# Patient Record
Sex: Male | Born: 1955
Health system: Southern US, Community
[De-identification: ages and names within clinical notes are randomized; demographics above are authoritative.]

## PROBLEM LIST (undated history)

## (undated) DIAGNOSIS — M1990 Unspecified osteoarthritis, unspecified site: Secondary | ICD-10-CM

## (undated) DIAGNOSIS — IMO0001 Reserved for inherently not codable concepts without codable children: Secondary | ICD-10-CM

## (undated) DIAGNOSIS — E785 Hyperlipidemia, unspecified: Secondary | ICD-10-CM

## (undated) DIAGNOSIS — K219 Gastro-esophageal reflux disease without esophagitis: Secondary | ICD-10-CM

## (undated) DIAGNOSIS — J302 Other seasonal allergic rhinitis: Secondary | ICD-10-CM

## (undated) DIAGNOSIS — L309 Dermatitis, unspecified: Secondary | ICD-10-CM

## (undated) DIAGNOSIS — R7309 Other abnormal glucose: Secondary | ICD-10-CM

## (undated) DIAGNOSIS — F1910 Other psychoactive substance abuse, uncomplicated: Secondary | ICD-10-CM

## (undated) DIAGNOSIS — R011 Cardiac murmur, unspecified: Secondary | ICD-10-CM

## (undated) DIAGNOSIS — S82899A Other fracture of unspecified lower leg, initial encounter for closed fracture: Secondary | ICD-10-CM

## (undated) DIAGNOSIS — I10 Essential (primary) hypertension: Secondary | ICD-10-CM

## (undated) DIAGNOSIS — F32A Depression, unspecified: Secondary | ICD-10-CM

## (undated) DIAGNOSIS — F329 Major depressive disorder, single episode, unspecified: Secondary | ICD-10-CM

## (undated) DIAGNOSIS — E119 Type 2 diabetes mellitus without complications: Secondary | ICD-10-CM

## (undated) DIAGNOSIS — F419 Anxiety disorder, unspecified: Secondary | ICD-10-CM

## (undated) DIAGNOSIS — R030 Elevated blood-pressure reading, without diagnosis of hypertension: Secondary | ICD-10-CM

## (undated) DIAGNOSIS — H269 Unspecified cataract: Secondary | ICD-10-CM

## (undated) HISTORY — DX: Other abnormal glucose: R73.09

## (undated) HISTORY — DX: Anxiety disorder, unspecified: F41.9

## (undated) HISTORY — PX: COLONOSCOPY: SHX174

## (undated) HISTORY — DX: Other seasonal allergic rhinitis: J30.2

## (undated) HISTORY — DX: Unspecified osteoarthritis, unspecified site: M19.90

## (undated) HISTORY — DX: Depression, unspecified: F32.A

## (undated) HISTORY — DX: Type 2 diabetes mellitus without complications: E11.9

## (undated) HISTORY — DX: Dermatitis, unspecified: L30.9

## (undated) HISTORY — DX: Essential (primary) hypertension: I10

## (undated) HISTORY — DX: Hyperlipidemia, unspecified: E78.5

## (undated) HISTORY — PX: JOINT REPLACEMENT: SHX530

## (undated) HISTORY — DX: Other fracture of unspecified lower leg, initial encounter for closed fracture: S82.899A

## (undated) HISTORY — DX: Major depressive disorder, single episode, unspecified: F32.9

## (undated) HISTORY — DX: Other psychoactive substance abuse, uncomplicated: F19.10

## (undated) HISTORY — PX: TONSILLECTOMY: SUR1361

## (undated) HISTORY — DX: Cardiac murmur, unspecified: R01.1

## (undated) HISTORY — DX: Reserved for inherently not codable concepts without codable children: IMO0001

## (undated) HISTORY — DX: Elevated blood-pressure reading, without diagnosis of hypertension: R03.0

## (undated) HISTORY — DX: Unspecified cataract: H26.9

## (undated) HISTORY — DX: Gastro-esophageal reflux disease without esophagitis: K21.9

---

## 2004-05-22 HISTORY — PX: OTHER SURGICAL HISTORY: SHX169

## 2004-12-27 ENCOUNTER — Ambulatory Visit: Payer: Self-pay | Admitting: Internal Medicine

## 2005-01-03 ENCOUNTER — Ambulatory Visit: Payer: Self-pay | Admitting: Internal Medicine

## 2005-03-10 ENCOUNTER — Encounter: Payer: Self-pay | Admitting: Emergency Medicine

## 2005-03-11 ENCOUNTER — Ambulatory Visit: Payer: Self-pay | Admitting: Physical Medicine & Rehabilitation

## 2005-03-11 ENCOUNTER — Inpatient Hospital Stay (HOSPITAL_COMMUNITY): Admission: EM | Admit: 2005-03-11 | Discharge: 2005-03-18 | Payer: Self-pay | Admitting: Emergency Medicine

## 2005-04-04 ENCOUNTER — Inpatient Hospital Stay (HOSPITAL_COMMUNITY): Admission: RE | Admit: 2005-04-04 | Discharge: 2005-04-08 | Payer: Self-pay | Admitting: Orthopedic Surgery

## 2005-04-04 ENCOUNTER — Ambulatory Visit: Payer: Self-pay | Admitting: Physical Medicine & Rehabilitation

## 2005-07-06 ENCOUNTER — Ambulatory Visit: Payer: Self-pay | Admitting: Internal Medicine

## 2005-09-22 ENCOUNTER — Ambulatory Visit: Payer: Self-pay | Admitting: Licensed Clinical Social Worker

## 2005-10-04 ENCOUNTER — Ambulatory Visit: Payer: Self-pay | Admitting: Licensed Clinical Social Worker

## 2005-10-11 ENCOUNTER — Ambulatory Visit: Payer: Self-pay | Admitting: Licensed Clinical Social Worker

## 2005-10-18 ENCOUNTER — Ambulatory Visit: Payer: Self-pay | Admitting: Licensed Clinical Social Worker

## 2005-10-20 ENCOUNTER — Ambulatory Visit: Payer: Self-pay | Admitting: Licensed Clinical Social Worker

## 2005-10-27 ENCOUNTER — Ambulatory Visit: Payer: Self-pay | Admitting: Licensed Clinical Social Worker

## 2005-11-07 ENCOUNTER — Ambulatory Visit (HOSPITAL_BASED_OUTPATIENT_CLINIC_OR_DEPARTMENT_OTHER): Admission: RE | Admit: 2005-11-07 | Discharge: 2005-11-07 | Payer: Self-pay | Admitting: Orthopedic Surgery

## 2005-11-21 ENCOUNTER — Ambulatory Visit: Payer: Self-pay | Admitting: Licensed Clinical Social Worker

## 2005-12-04 ENCOUNTER — Ambulatory Visit: Payer: Self-pay | Admitting: Licensed Clinical Social Worker

## 2005-12-19 ENCOUNTER — Ambulatory Visit: Payer: Self-pay | Admitting: Licensed Clinical Social Worker

## 2006-01-12 ENCOUNTER — Ambulatory Visit: Payer: Self-pay | Admitting: Licensed Clinical Social Worker

## 2006-01-26 ENCOUNTER — Ambulatory Visit: Payer: Self-pay | Admitting: Licensed Clinical Social Worker

## 2006-02-01 ENCOUNTER — Ambulatory Visit: Payer: Self-pay | Admitting: Internal Medicine

## 2006-02-09 ENCOUNTER — Ambulatory Visit: Payer: Self-pay | Admitting: Licensed Clinical Social Worker

## 2006-03-14 ENCOUNTER — Ambulatory Visit: Payer: Self-pay | Admitting: Gastroenterology

## 2006-03-26 ENCOUNTER — Ambulatory Visit: Payer: Self-pay | Admitting: Gastroenterology

## 2006-05-10 ENCOUNTER — Ambulatory Visit: Payer: Self-pay | Admitting: Licensed Clinical Social Worker

## 2006-06-11 ENCOUNTER — Ambulatory Visit: Payer: Self-pay | Admitting: Internal Medicine

## 2006-06-11 LAB — CONVERTED CEMR LAB
ALT: 56 units/L — ABNORMAL HIGH (ref 0–40)
AST: 28 units/L (ref 0–37)
Alkaline Phosphatase: 78 units/L (ref 39–117)
BUN: 18 mg/dL (ref 6–23)
Calcium: 9.7 mg/dL (ref 8.4–10.5)
Chloride: 101 meq/L (ref 96–112)
Cholesterol: 203 mg/dL (ref 0–200)
GFR calc Af Amer: 114 mL/min
Glucose, Bld: 110 mg/dL — ABNORMAL HIGH (ref 70–99)
HDL: 53.1 mg/dL (ref >39.0)
Hgb A1c MFr Bld: 5.8 % (ref 4.6–6.0)
Potassium: 4.1 meq/L (ref 3.5–5.1)
Total Protein: 7.3 g/dL (ref 6.0–8.3)

## 2006-06-13 ENCOUNTER — Ambulatory Visit: Payer: Self-pay | Admitting: Internal Medicine

## 2006-08-09 ENCOUNTER — Ambulatory Visit: Payer: Self-pay | Admitting: Licensed Clinical Social Worker

## 2006-09-04 ENCOUNTER — Ambulatory Visit: Payer: Self-pay | Admitting: Licensed Clinical Social Worker

## 2006-09-18 ENCOUNTER — Ambulatory Visit: Payer: Self-pay | Admitting: Licensed Clinical Social Worker

## 2006-09-25 ENCOUNTER — Ambulatory Visit: Payer: Self-pay | Admitting: Licensed Clinical Social Worker

## 2006-10-02 ENCOUNTER — Ambulatory Visit: Payer: Self-pay | Admitting: Licensed Clinical Social Worker

## 2006-10-10 ENCOUNTER — Ambulatory Visit: Payer: Self-pay | Admitting: Licensed Clinical Social Worker

## 2006-10-22 ENCOUNTER — Ambulatory Visit: Payer: Self-pay | Admitting: Licensed Clinical Social Worker
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
## 2006-10-28 IMAGING — RF DG TIBIA/FIBULA 2V*L*
1 series · 3 of 3 positions shown · non-contrast
Comparison: 03/10/05.

CLINICAL DATA: External fixation for comminuted tibial plateau fracture.
 LEFT TIBIA AND FIBULA ? 2 VIEW ? 03/11/05:

[Series 1: run · 3 of 3 slices shown]
[im 1/3]
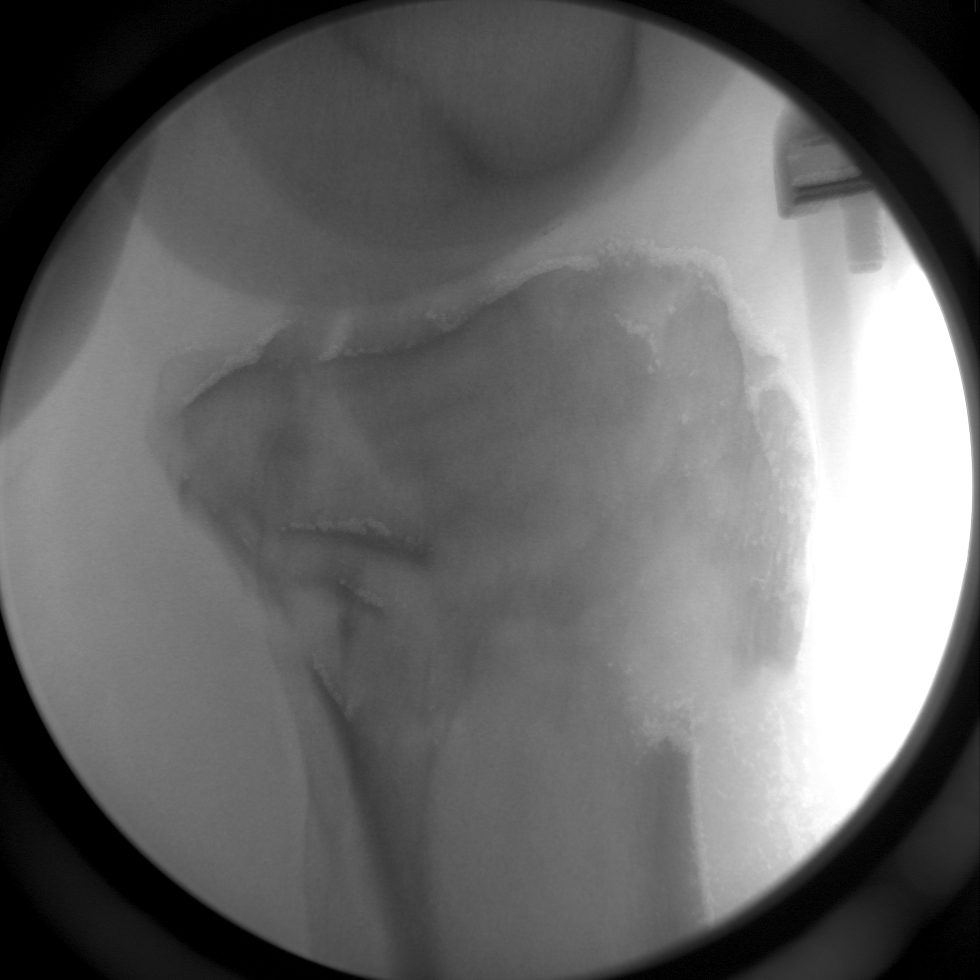
[im 2/3]
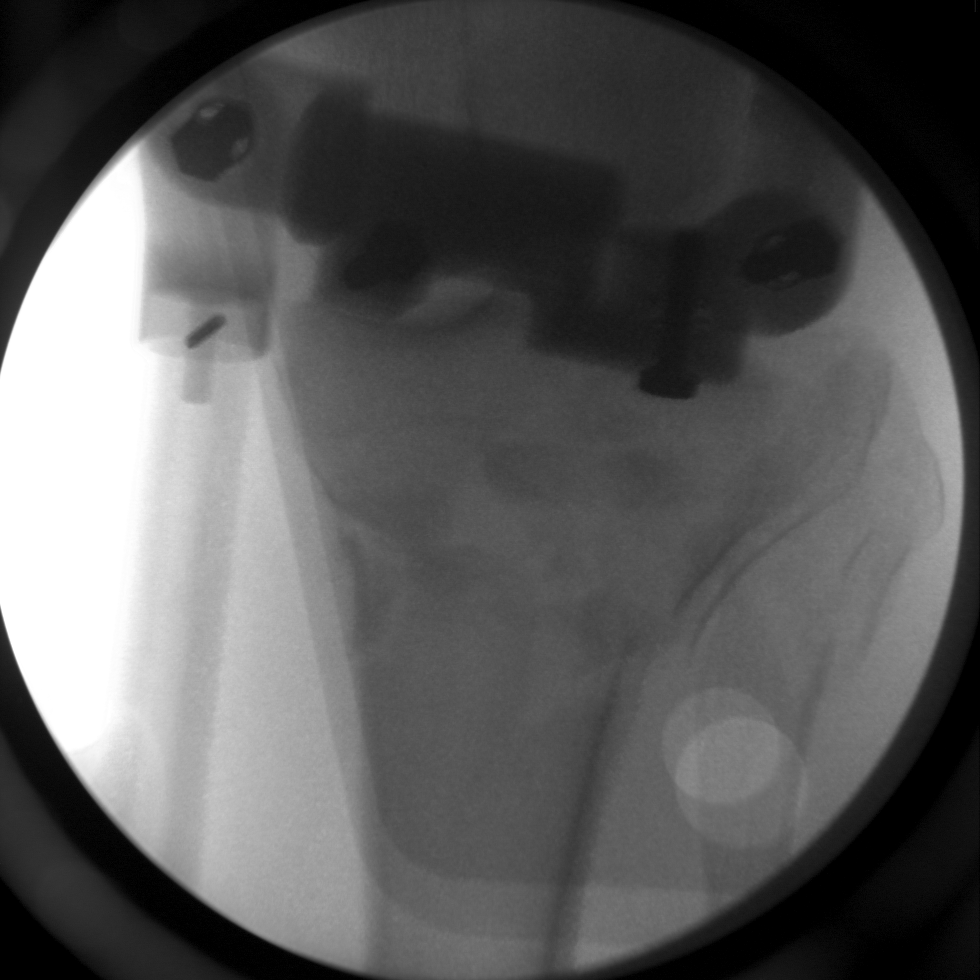
[im 3/3]
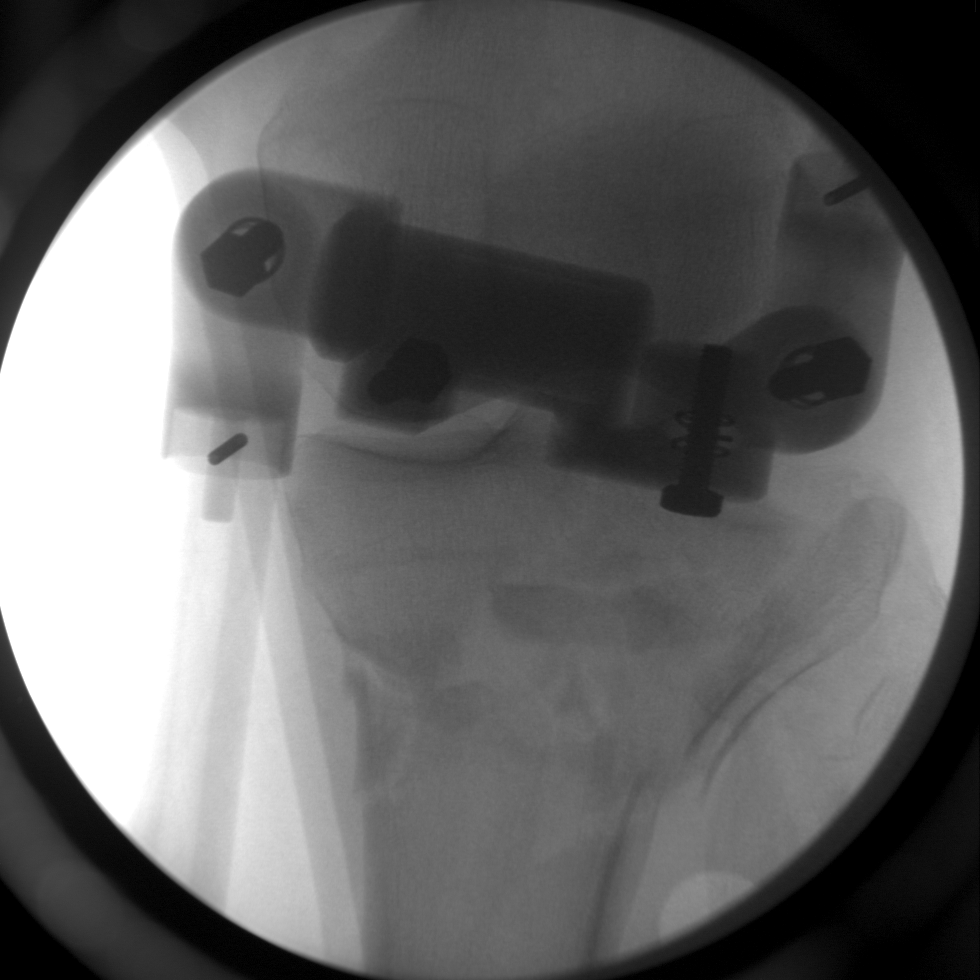

[3 of 3 positions shown; findings below may reference images not displayed]

FINDINGS: Three intraoperative spot fluoro images are submitted postoperatively.  Comminuted tibial plateau fracture with associated fracture of the proximal fibula is again noted.  Images were obtained during/after placement of an external fixator.
IMPRESSION: Intraoperative localization.

## 2006-10-29 ENCOUNTER — Ambulatory Visit: Payer: Self-pay | Admitting: Licensed Clinical Social Worker

## 2006-10-29 IMAGING — CR DG CHEST 1V PORT
1 series · 1 of 1 positions shown · non-contrast
Comparison: 03/10/05.

CLINICAL DATA: Fever.  
 PORTABLE CHEST - 1 VIEW - 03/12/05:

[view not recorded]
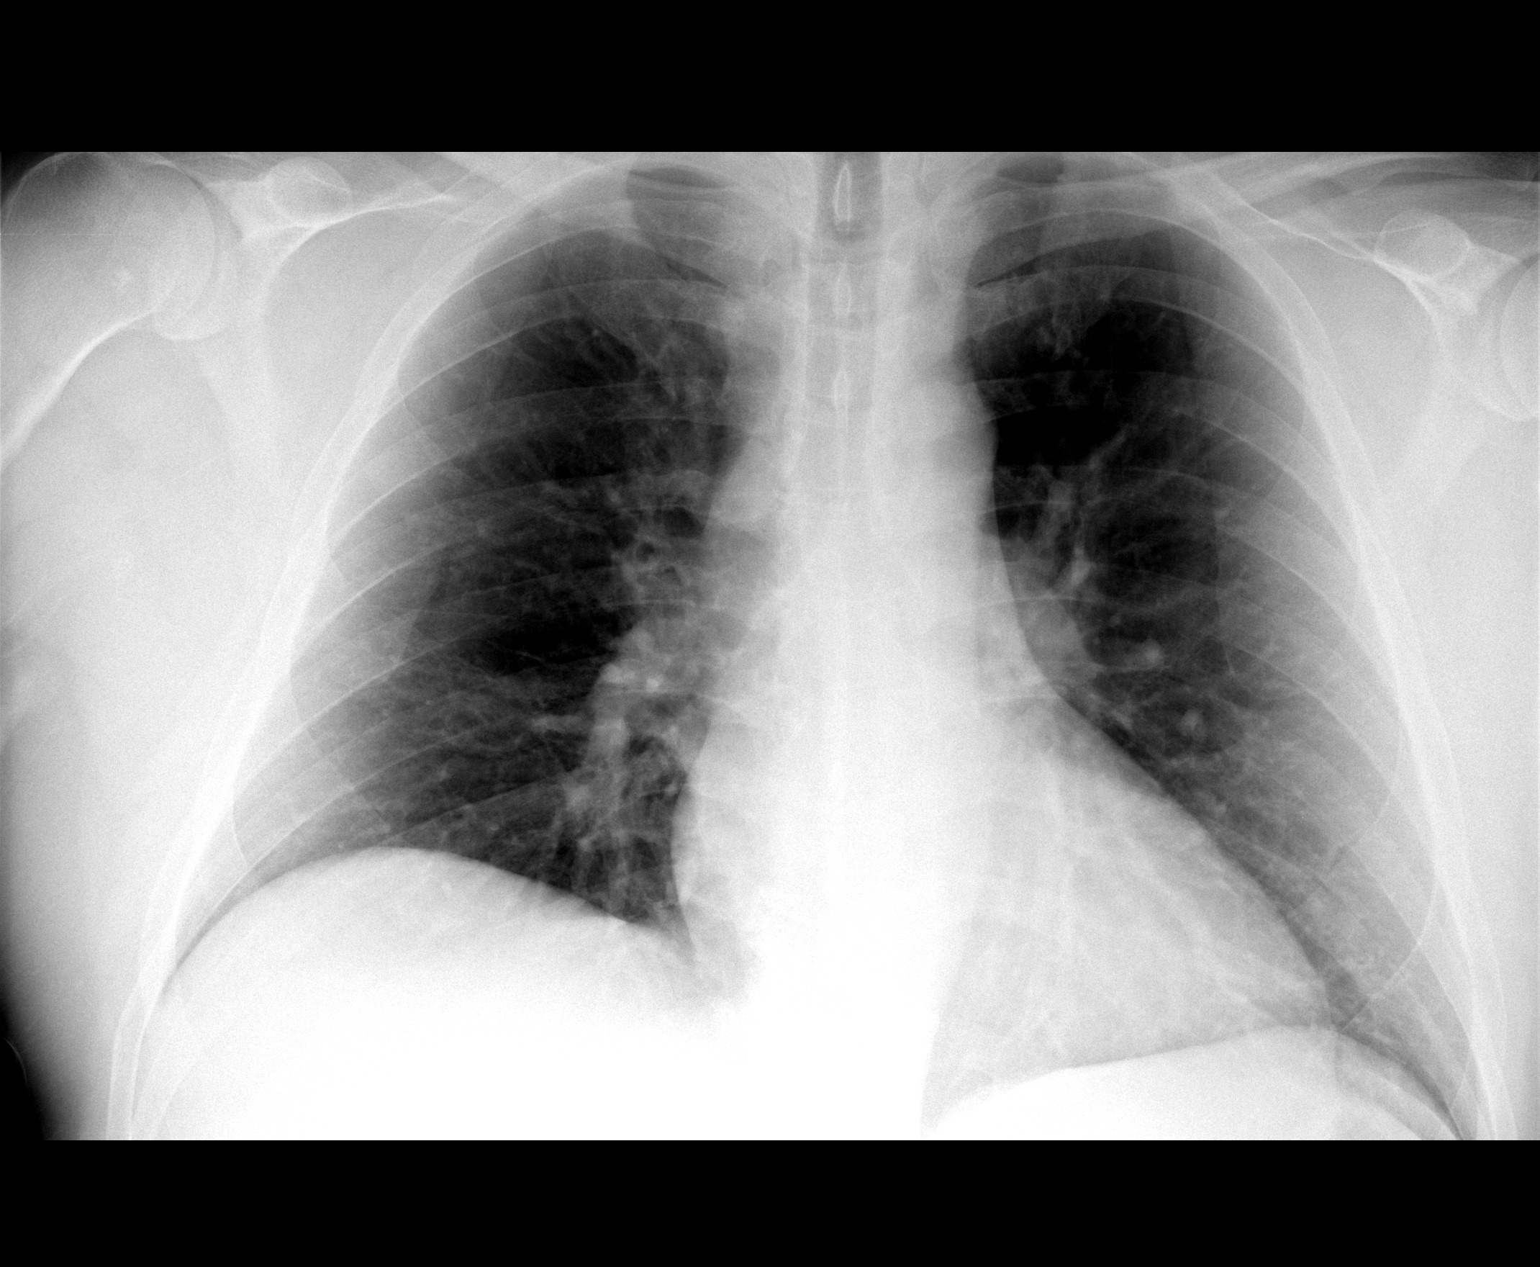

[1 of 1 positions shown; findings below may reference images not displayed]

FINDINGS: The cardiomediastinal silhouette is unchanged.  Mild peribronchial thickening is stable.  There is no evidence of focal air space disease.
IMPRESSION: Mild peribronchial thickening without focal air space disease.

## 2006-11-07 ENCOUNTER — Ambulatory Visit: Payer: Self-pay | Admitting: Licensed Clinical Social Worker

## 2006-11-14 ENCOUNTER — Ambulatory Visit: Payer: Self-pay | Admitting: Licensed Clinical Social Worker

## 2006-11-26 ENCOUNTER — Ambulatory Visit: Payer: Self-pay | Admitting: Licensed Clinical Social Worker

## 2006-12-06 ENCOUNTER — Ambulatory Visit: Payer: Self-pay | Admitting: Licensed Clinical Social Worker

## 2006-12-18 ENCOUNTER — Ambulatory Visit: Payer: Self-pay | Admitting: Licensed Clinical Social Worker

## 2006-12-28 ENCOUNTER — Ambulatory Visit: Payer: Self-pay | Admitting: Licensed Clinical Social Worker

## 2007-01-03 ENCOUNTER — Ambulatory Visit: Payer: Self-pay | Admitting: Licensed Clinical Social Worker

## 2007-01-09 ENCOUNTER — Ambulatory Visit: Payer: Self-pay | Admitting: Licensed Clinical Social Worker

## 2007-01-17 ENCOUNTER — Ambulatory Visit: Payer: Self-pay | Admitting: Licensed Clinical Social Worker

## 2007-02-06 ENCOUNTER — Ambulatory Visit: Payer: Self-pay | Admitting: Licensed Clinical Social Worker

## 2007-02-20 ENCOUNTER — Ambulatory Visit: Payer: Self-pay | Admitting: Licensed Clinical Social Worker

## 2007-03-07 ENCOUNTER — Ambulatory Visit: Payer: Self-pay | Admitting: Licensed Clinical Social Worker

## 2007-03-15 ENCOUNTER — Ambulatory Visit: Payer: Self-pay | Admitting: Licensed Clinical Social Worker

## 2007-03-27 ENCOUNTER — Encounter: Payer: Self-pay | Admitting: *Deleted

## 2007-03-27 ENCOUNTER — Ambulatory Visit: Payer: Self-pay | Admitting: Licensed Clinical Social Worker

## 2007-03-27 DIAGNOSIS — E785 Hyperlipidemia, unspecified: Secondary | ICD-10-CM | POA: Insufficient documentation

## 2007-03-27 DIAGNOSIS — S82899A Other fracture of unspecified lower leg, initial encounter for closed fracture: Secondary | ICD-10-CM | POA: Insufficient documentation

## 2007-03-27 DIAGNOSIS — Z9089 Acquired absence of other organs: Secondary | ICD-10-CM | POA: Insufficient documentation

## 2007-04-04 ENCOUNTER — Ambulatory Visit: Payer: Self-pay | Admitting: Licensed Clinical Social Worker

## 2007-04-12 ENCOUNTER — Ambulatory Visit: Payer: Self-pay | Admitting: Licensed Clinical Social Worker

## 2007-04-19 ENCOUNTER — Ambulatory Visit: Payer: Self-pay | Admitting: Licensed Clinical Social Worker

## 2007-04-22 ENCOUNTER — Ambulatory Visit: Payer: Self-pay | Admitting: Internal Medicine

## 2007-04-22 DIAGNOSIS — L600 Ingrowing nail: Secondary | ICD-10-CM | POA: Insufficient documentation

## 2007-04-22 DIAGNOSIS — F32A Depression, unspecified: Secondary | ICD-10-CM | POA: Insufficient documentation

## 2007-04-22 DIAGNOSIS — I10 Essential (primary) hypertension: Secondary | ICD-10-CM | POA: Insufficient documentation

## 2007-04-22 DIAGNOSIS — F329 Major depressive disorder, single episode, unspecified: Secondary | ICD-10-CM

## 2007-04-24 ENCOUNTER — Encounter: Payer: Self-pay | Admitting: Internal Medicine

## 2007-04-30 ENCOUNTER — Ambulatory Visit: Payer: Self-pay | Admitting: Licensed Clinical Social Worker

## 2007-05-09 ENCOUNTER — Ambulatory Visit: Payer: Self-pay | Admitting: Licensed Clinical Social Worker

## 2007-05-27 ENCOUNTER — Ambulatory Visit: Payer: Self-pay | Admitting: Licensed Clinical Social Worker

## 2007-06-07 ENCOUNTER — Ambulatory Visit: Payer: Self-pay | Admitting: Licensed Clinical Social Worker

## 2007-06-14 ENCOUNTER — Ambulatory Visit: Payer: Self-pay | Admitting: Licensed Clinical Social Worker

## 2007-06-24 ENCOUNTER — Ambulatory Visit: Payer: Self-pay | Admitting: Licensed Clinical Social Worker

## 2007-07-01 ENCOUNTER — Ambulatory Visit: Payer: Self-pay | Admitting: Licensed Clinical Social Worker

## 2007-07-08 ENCOUNTER — Ambulatory Visit: Payer: Self-pay | Admitting: Licensed Clinical Social Worker

## 2007-07-23 ENCOUNTER — Ambulatory Visit: Payer: Self-pay | Admitting: Licensed Clinical Social Worker

## 2007-07-31 ENCOUNTER — Ambulatory Visit: Payer: Self-pay | Admitting: Licensed Clinical Social Worker

## 2007-08-14 ENCOUNTER — Ambulatory Visit: Payer: Self-pay | Admitting: Licensed Clinical Social Worker

## 2007-08-16 ENCOUNTER — Ambulatory Visit: Payer: Self-pay | Admitting: Internal Medicine

## 2007-08-20 LAB — CONVERTED CEMR LAB
ALT: 62 units/L — ABNORMAL HIGH (ref 0–53)
AST: 28 units/L (ref 0–37)
Albumin: 4.3 g/dL (ref 3.5–5.2)
BUN: 19 mg/dL (ref 6–23)
Basophils Relative: 1.3 % — ABNORMAL HIGH (ref 0.0–1.0)
CO2: 27 meq/L (ref 19–32)
Chloride: 105 meq/L (ref 96–112)
Creatinine, Ser: 0.9 mg/dL (ref 0.4–1.5)
Direct LDL: 137 mg/dL
Eosinophils Relative: 1.3 % (ref 0.0–5.0)
HDL: 47 mg/dL (ref >39.0)
Hemoglobin: 14.2 g/dL (ref 13.0–17.0)
Lymphocytes Relative: 27.6 % (ref 12.0–46.0)
MCV: 88.6 fL (ref 78.0–100.0)
Neutro Abs: 3.4 10*3/uL (ref 1.4–7.7)
Neutrophils Relative %: 60.1 % (ref 43.0–77.0)
RBC: 4.8 M/uL (ref 4.22–5.81)
Total Protein: 7.6 g/dL (ref 6.0–8.3)
VLDL: 26 mg/dL (ref 0–40)
WBC: 5.8 10*3/uL (ref 4.5–10.5)

## 2007-08-21 ENCOUNTER — Ambulatory Visit: Payer: Self-pay | Admitting: Internal Medicine

## 2007-08-21 DIAGNOSIS — M1990 Unspecified osteoarthritis, unspecified site: Secondary | ICD-10-CM | POA: Insufficient documentation

## 2007-08-21 DIAGNOSIS — M25559 Pain in unspecified hip: Secondary | ICD-10-CM | POA: Insufficient documentation

## 2007-08-22 ENCOUNTER — Ambulatory Visit: Payer: Self-pay | Admitting: Licensed Clinical Social Worker

## 2007-09-03 ENCOUNTER — Ambulatory Visit: Payer: Self-pay | Admitting: Licensed Clinical Social Worker

## 2007-09-04 ENCOUNTER — Encounter: Payer: Self-pay | Admitting: Internal Medicine

## 2007-09-10 ENCOUNTER — Ambulatory Visit: Payer: Self-pay | Admitting: Licensed Clinical Social Worker

## 2007-09-24 ENCOUNTER — Ambulatory Visit: Payer: Self-pay | Admitting: Licensed Clinical Social Worker

## 2007-10-07 ENCOUNTER — Ambulatory Visit: Payer: Self-pay | Admitting: Licensed Clinical Social Worker

## 2007-10-17 ENCOUNTER — Ambulatory Visit: Payer: Self-pay | Admitting: Licensed Clinical Social Worker

## 2007-11-08 ENCOUNTER — Ambulatory Visit: Payer: Self-pay | Admitting: Licensed Clinical Social Worker

## 2007-12-05 ENCOUNTER — Encounter: Payer: Self-pay | Admitting: Internal Medicine

## 2007-12-09 ENCOUNTER — Ambulatory Visit: Payer: Self-pay | Admitting: Licensed Clinical Social Worker

## 2007-12-23 ENCOUNTER — Ambulatory Visit: Payer: Self-pay | Admitting: Licensed Clinical Social Worker

## 2008-01-08 ENCOUNTER — Ambulatory Visit: Payer: Self-pay | Admitting: Licensed Clinical Social Worker

## 2008-01-16 ENCOUNTER — Ambulatory Visit: Payer: Self-pay | Admitting: Licensed Clinical Social Worker

## 2008-02-03 ENCOUNTER — Ambulatory Visit: Payer: Self-pay | Admitting: Licensed Clinical Social Worker

## 2008-02-14 ENCOUNTER — Ambulatory Visit: Payer: Self-pay | Admitting: Licensed Clinical Social Worker

## 2008-02-14 ENCOUNTER — Ambulatory Visit: Payer: Self-pay | Admitting: Internal Medicine

## 2008-02-18 LAB — CONVERTED CEMR LAB
CO2: 27 meq/L (ref 19–32)
Calcium: 9.1 mg/dL (ref 8.4–10.5)
Chloride: 106 meq/L (ref 96–112)
Direct LDL: 89.6 mg/dL
Hgb A1c MFr Bld: 6.1 % — ABNORMAL HIGH (ref 4.6–6.0)
Potassium: 4 meq/L (ref 3.5–5.1)
Sodium: 139 meq/L (ref 135–145)
Total CHOL/HDL Ratio: 4.3

## 2008-02-21 ENCOUNTER — Ambulatory Visit: Payer: Self-pay | Admitting: Internal Medicine

## 2008-02-21 DIAGNOSIS — R7309 Other abnormal glucose: Secondary | ICD-10-CM | POA: Insufficient documentation

## 2008-03-04 ENCOUNTER — Ambulatory Visit: Payer: Self-pay | Admitting: Licensed Clinical Social Worker

## 2008-03-20 ENCOUNTER — Ambulatory Visit: Payer: Self-pay | Admitting: Internal Medicine

## 2008-03-24 ENCOUNTER — Ambulatory Visit: Payer: Self-pay | Admitting: Licensed Clinical Social Worker

## 2008-05-22 HISTORY — PX: COLONOSCOPY: SHX174

## 2008-05-26 ENCOUNTER — Ambulatory Visit: Payer: Self-pay | Admitting: Licensed Clinical Social Worker

## 2008-06-03 ENCOUNTER — Ambulatory Visit: Payer: Self-pay | Admitting: Licensed Clinical Social Worker

## 2008-06-10 ENCOUNTER — Ambulatory Visit: Payer: Self-pay | Admitting: Licensed Clinical Social Worker

## 2008-06-23 ENCOUNTER — Ambulatory Visit: Payer: Self-pay | Admitting: Internal Medicine

## 2008-06-25 LAB — CONVERTED CEMR LAB
CO2: 27 meq/L (ref 19–32)
Chloride: 105 meq/L (ref 96–112)
Cholesterol: 200 mg/dL (ref 0–200)
Creatinine, Ser: 0.8 mg/dL (ref 0.4–1.5)
Hgb A1c MFr Bld: 6.2 % — ABNORMAL HIGH (ref 4.6–6.0)
Total CHOL/HDL Ratio: 4.6
Triglycerides: 181 mg/dL — ABNORMAL HIGH (ref 0–149)

## 2008-06-29 ENCOUNTER — Ambulatory Visit: Payer: Self-pay | Admitting: Internal Medicine

## 2008-06-30 ENCOUNTER — Ambulatory Visit: Payer: Self-pay | Admitting: Licensed Clinical Social Worker

## 2008-07-14 ENCOUNTER — Ambulatory Visit: Payer: Self-pay | Admitting: Licensed Clinical Social Worker

## 2008-07-21 ENCOUNTER — Ambulatory Visit: Payer: Self-pay | Admitting: Licensed Clinical Social Worker

## 2008-07-31 ENCOUNTER — Ambulatory Visit: Payer: Self-pay | Admitting: Licensed Clinical Social Worker

## 2008-08-03 ENCOUNTER — Encounter: Payer: Self-pay | Admitting: Internal Medicine

## 2008-09-01 ENCOUNTER — Ambulatory Visit: Payer: Self-pay | Admitting: Licensed Clinical Social Worker

## 2008-09-04 ENCOUNTER — Ambulatory Visit: Payer: Self-pay | Admitting: Licensed Clinical Social Worker

## 2008-09-16 ENCOUNTER — Ambulatory Visit: Payer: Self-pay | Admitting: Licensed Clinical Social Worker

## 2008-09-17 ENCOUNTER — Telehealth: Payer: Self-pay | Admitting: Internal Medicine

## 2008-09-25 ENCOUNTER — Ambulatory Visit: Payer: Self-pay | Admitting: Licensed Clinical Social Worker

## 2008-10-05 ENCOUNTER — Ambulatory Visit: Payer: Self-pay | Admitting: Licensed Clinical Social Worker

## 2008-10-13 ENCOUNTER — Ambulatory Visit: Payer: Self-pay | Admitting: Licensed Clinical Social Worker

## 2008-10-20 ENCOUNTER — Ambulatory Visit: Payer: Self-pay | Admitting: Licensed Clinical Social Worker

## 2008-10-28 ENCOUNTER — Ambulatory Visit: Payer: Self-pay | Admitting: Licensed Clinical Social Worker

## 2008-11-11 ENCOUNTER — Ambulatory Visit: Payer: Self-pay | Admitting: Licensed Clinical Social Worker

## 2008-11-26 ENCOUNTER — Ambulatory Visit: Payer: Self-pay | Admitting: Internal Medicine

## 2008-11-26 LAB — CONVERTED CEMR LAB
CO2: 28 meq/L (ref 19–32)
Chloride: 101 meq/L (ref 96–112)
Creatinine, Ser: 0.9 mg/dL (ref 0.4–1.5)
HDL: 45.1 mg/dL (ref >39.00)
Hgb A1c MFr Bld: 6.2 % (ref 4.6–6.5)
Sodium: 138 meq/L (ref 135–145)
Total CHOL/HDL Ratio: 5
Triglycerides: 368 mg/dL — ABNORMAL HIGH (ref 0.0–149.0)
VLDL: 73.6 mg/dL — ABNORMAL HIGH (ref 0.0–40.0)

## 2008-12-01 ENCOUNTER — Ambulatory Visit: Payer: Self-pay | Admitting: Internal Medicine

## 2008-12-02 ENCOUNTER — Ambulatory Visit: Payer: Self-pay | Admitting: Licensed Clinical Social Worker

## 2008-12-30 ENCOUNTER — Ambulatory Visit: Payer: Self-pay | Admitting: Licensed Clinical Social Worker

## 2009-01-27 ENCOUNTER — Ambulatory Visit: Payer: Self-pay | Admitting: Licensed Clinical Social Worker

## 2009-02-05 ENCOUNTER — Ambulatory Visit: Payer: Self-pay | Admitting: Licensed Clinical Social Worker

## 2009-02-19 ENCOUNTER — Ambulatory Visit: Payer: Self-pay | Admitting: Licensed Clinical Social Worker

## 2009-03-05 ENCOUNTER — Ambulatory Visit: Payer: Self-pay | Admitting: Licensed Clinical Social Worker

## 2009-03-23 ENCOUNTER — Ambulatory Visit: Payer: Self-pay | Admitting: Licensed Clinical Social Worker

## 2009-04-07 ENCOUNTER — Ambulatory Visit: Payer: Self-pay | Admitting: Licensed Clinical Social Worker

## 2009-04-12 ENCOUNTER — Ambulatory Visit: Payer: Self-pay | Admitting: Internal Medicine

## 2009-04-12 DIAGNOSIS — Z87891 Personal history of nicotine dependence: Secondary | ICD-10-CM | POA: Insufficient documentation

## 2009-04-14 ENCOUNTER — Ambulatory Visit: Payer: Self-pay | Admitting: Internal Medicine

## 2009-04-20 LAB — CONVERTED CEMR LAB
ALT: 69 units/L — ABNORMAL HIGH (ref 0–53)
AST: 27 units/L (ref 0–37)
Albumin: 4.5 g/dL (ref 3.5–5.2)
Alkaline Phosphatase: 63 units/L (ref 39–117)
Direct LDL: 101.7 mg/dL
Eosinophils Relative: 1.3 % (ref 0.0–5.0)
GFR calc non Af Amer: 124.68 mL/min (ref >60)
HCT: 42.3 % (ref 39.0–52.0)
Hemoglobin: 14.4 g/dL (ref 13.0–17.0)
Hgb A1c MFr Bld: 6.1 % (ref 4.6–6.5)
Lymphs Abs: 1.5 10*3/uL (ref 0.7–4.0)
Monocytes Relative: 9.3 % (ref 3.0–12.0)
Neutro Abs: 4 10*3/uL (ref 1.4–7.7)
Potassium: 4.1 meq/L (ref 3.5–5.1)
Sodium: 137 meq/L (ref 135–145)
Total Bilirubin: 1 mg/dL (ref 0.3–1.2)
Total CHOL/HDL Ratio: 4
Vitamin B-12: 440 pg/mL (ref 211–911)
WBC: 6.3 10*3/uL (ref 4.5–10.5)

## 2009-04-28 ENCOUNTER — Ambulatory Visit: Payer: Self-pay | Admitting: Licensed Clinical Social Worker

## 2009-05-17 ENCOUNTER — Ambulatory Visit: Payer: Self-pay | Admitting: Licensed Clinical Social Worker

## 2009-05-22 HISTORY — PX: JOINT REPLACEMENT: SHX530

## 2009-05-31 ENCOUNTER — Ambulatory Visit: Payer: Self-pay | Admitting: Licensed Clinical Social Worker

## 2009-07-06 ENCOUNTER — Ambulatory Visit: Payer: Self-pay | Admitting: Licensed Clinical Social Worker

## 2009-07-08 ENCOUNTER — Telehealth: Payer: Self-pay | Admitting: Internal Medicine

## 2009-07-08 ENCOUNTER — Ambulatory Visit: Payer: Self-pay | Admitting: Internal Medicine

## 2009-07-08 LAB — CONVERTED CEMR LAB
BUN: 14 mg/dL (ref 6–23)
Bilirubin, Direct: 0.1 mg/dL (ref 0.0–0.3)
CO2: 29 meq/L (ref 19–32)
Calcium: 9.3 mg/dL (ref 8.4–10.5)
Cholesterol: 176 mg/dL (ref 0–200)
Creatinine, Ser: 0.8 mg/dL (ref 0.4–1.5)
HDL: 47.5 mg/dL (ref >39.00)
Total CHOL/HDL Ratio: 4
Total Protein: 7.5 g/dL (ref 6.0–8.3)
Triglycerides: 172 mg/dL — ABNORMAL HIGH (ref 0.0–149.0)

## 2009-07-09 ENCOUNTER — Telehealth: Payer: Self-pay | Admitting: Internal Medicine

## 2009-07-15 ENCOUNTER — Ambulatory Visit: Payer: Self-pay | Admitting: Licensed Clinical Social Worker

## 2009-07-15 ENCOUNTER — Ambulatory Visit: Payer: Self-pay | Admitting: Internal Medicine

## 2009-07-15 DIAGNOSIS — L259 Unspecified contact dermatitis, unspecified cause: Secondary | ICD-10-CM | POA: Insufficient documentation

## 2009-09-13 ENCOUNTER — Telehealth: Payer: Self-pay | Admitting: Internal Medicine

## 2009-09-28 ENCOUNTER — Ambulatory Visit: Payer: Self-pay | Admitting: Licensed Clinical Social Worker

## 2009-10-26 ENCOUNTER — Ambulatory Visit: Payer: Self-pay | Admitting: Licensed Clinical Social Worker

## 2009-11-09 ENCOUNTER — Ambulatory Visit: Payer: Self-pay | Admitting: Internal Medicine

## 2009-11-09 LAB — CONVERTED CEMR LAB
ALT: 42 units/L (ref 0–53)
Albumin: 4.6 g/dL (ref 3.5–5.2)
BUN: 23 mg/dL (ref 6–23)
Bilirubin Urine: NEGATIVE
CO2: 28 meq/L (ref 19–32)
Calcium: 9.3 mg/dL (ref 8.4–10.5)
Chloride: 104 meq/L (ref 96–112)
Cholesterol: 198 mg/dL (ref 0–200)
Creatinine, Ser: 0.9 mg/dL (ref 0.4–1.5)
Eosinophils Absolute: 0.1 10*3/uL (ref 0.0–0.7)
Eosinophils Relative: 1.6 % (ref 0.0–5.0)
Glucose, Bld: 106 mg/dL — ABNORMAL HIGH (ref 70–99)
HCT: 41.7 % (ref 39.0–52.0)
HDL: 47 mg/dL (ref >39.00)
Hemoglobin, Urine: NEGATIVE
Ketones, ur: NEGATIVE mg/dL
Leukocytes, UA: NEGATIVE
Lymphs Abs: 1.6 10*3/uL (ref 0.7–4.0)
MCHC: 34.2 g/dL (ref 30.0–36.0)
MCV: 88.1 fL (ref 78.0–100.0)
Monocytes Absolute: 0.6 10*3/uL (ref 0.1–1.0)
Neutrophils Relative %: 65.3 % (ref 43.0–77.0)
Nitrite: NEGATIVE
Platelets: 222 10*3/uL (ref 150.0–400.0)
TSH: 1.22 microintl units/mL (ref 0.35–5.50)
Total Protein, Urine: NEGATIVE mg/dL
Total Protein: 7.6 g/dL (ref 6.0–8.3)
Triglycerides: 380 mg/dL — ABNORMAL HIGH (ref 0.0–149.0)
pH: 6 (ref 5.0–8.0)

## 2009-11-12 ENCOUNTER — Ambulatory Visit: Payer: Self-pay | Admitting: Internal Medicine

## 2009-12-02 ENCOUNTER — Ambulatory Visit: Payer: Self-pay | Admitting: Licensed Clinical Social Worker

## 2009-12-21 ENCOUNTER — Ambulatory Visit: Payer: Self-pay | Admitting: Licensed Clinical Social Worker

## 2010-05-09 ENCOUNTER — Ambulatory Visit: Payer: Self-pay | Admitting: Internal Medicine

## 2010-05-10 ENCOUNTER — Ambulatory Visit: Payer: Self-pay | Admitting: Licensed Clinical Social Worker

## 2010-05-12 LAB — CONVERTED CEMR LAB
BUN: 20 mg/dL (ref 6–23)
Basophils Absolute: 0.1 10*3/uL (ref 0.0–0.1)
Bilirubin, Direct: 0.1 mg/dL (ref 0.0–0.3)
Blood, UA: NEGATIVE
Chloride: 102 meq/L (ref 96–112)
Cholesterol: 194 mg/dL (ref 0–200)
Creatinine, Ser: 0.7 mg/dL (ref 0.4–1.5)
Eosinophils Absolute: 0.2 10*3/uL (ref 0.0–0.7)
Glucose, Bld: 99 mg/dL (ref 70–99)
HCT: 40.4 % (ref 39.0–52.0)
HDL: 40.1 mg/dL (ref >39.00)
Leukocytes, UA: NEGATIVE
Lymphs Abs: 1.9 10*3/uL (ref 0.7–4.0)
MCV: 86.7 fL (ref 78.0–100.0)
Monocytes Absolute: 0.8 10*3/uL (ref 0.1–1.0)
Neutrophils Relative %: 62.4 % (ref 43.0–77.0)
Nitrite: NEGATIVE
PSA: 1.03 ng/mL (ref 0.10–4.00)
Platelets: 249 10*3/uL (ref 150.0–400.0)
Potassium: 4.2 meq/L (ref 3.5–5.1)
RDW: 13.7 % (ref 11.5–14.6)
TSH: 1.55 microintl units/mL (ref 0.35–5.50)
Total Bilirubin: 0.6 mg/dL (ref 0.3–1.2)
Total Protein, Urine: NEGATIVE mg/dL
Triglycerides: 346 mg/dL — ABNORMAL HIGH (ref 0.0–149.0)
VLDL: 69.2 mg/dL — ABNORMAL HIGH (ref 0.0–40.0)
pH: 6 (ref 5.0–8.0)

## 2010-05-18 ENCOUNTER — Encounter: Payer: Self-pay | Admitting: Internal Medicine

## 2010-05-18 ENCOUNTER — Ambulatory Visit: Payer: Self-pay | Admitting: Internal Medicine

## 2010-05-22 HISTORY — PX: HIP SURGERY: SHX245

## 2010-05-24 ENCOUNTER — Telehealth (INDEPENDENT_AMBULATORY_CARE_PROVIDER_SITE_OTHER): Payer: Self-pay | Admitting: *Deleted

## 2010-05-25 ENCOUNTER — Encounter: Payer: Self-pay | Admitting: *Deleted

## 2010-05-25 ENCOUNTER — Encounter: Payer: Self-pay | Admitting: Cardiology

## 2010-05-25 ENCOUNTER — Ambulatory Visit: Admission: RE | Admit: 2010-05-25 | Discharge: 2010-05-25 | Payer: Self-pay | Source: Home / Self Care

## 2010-05-25 ENCOUNTER — Encounter (HOSPITAL_COMMUNITY)
Admission: RE | Admit: 2010-05-25 | Discharge: 2010-06-21 | Payer: Self-pay | Source: Home / Self Care | Attending: Internal Medicine | Admitting: Internal Medicine

## 2010-05-31 ENCOUNTER — Ambulatory Visit: Admit: 2010-05-31 | Payer: Self-pay | Admitting: Licensed Clinical Social Worker

## 2010-06-13 LAB — BASIC METABOLIC PANEL
BUN: 14 mg/dL (ref 6–23)
CO2: 27 mEq/L (ref 19–32)
Calcium: 10.1 mg/dL (ref 8.4–10.5)
Chloride: 102 mEq/L (ref 96–112)
Creatinine, Ser: 0.83 mg/dL (ref 0.4–1.5)
GFR calc Af Amer: >60 mL/min (ref >60)
GFR calc non Af Amer: >60 mL/min (ref >60)
Glucose, Bld: 84 mg/dL (ref 70–99)
Potassium: 4.3 mEq/L (ref 3.5–5.1)
Sodium: 137 mEq/L (ref 135–145)

## 2010-06-13 LAB — CBC
HCT: 42.3 % (ref 39.0–52.0)
Hemoglobin: 14.4 g/dL (ref 13.0–17.0)
MCH: 29.3 pg (ref 26.0–34.0)
MCHC: 34 g/dL (ref 30.0–36.0)
MCV: 86 fL (ref 78.0–100.0)
Platelets: 239 10*3/uL (ref 150–400)
RBC: 4.92 MIL/uL (ref 4.22–5.81)
RDW: 13 % (ref 11.5–15.5)
WBC: 7.1 10*3/uL (ref 4.0–10.5)

## 2010-06-13 LAB — SURGICAL PCR SCREEN
MRSA, PCR: NEGATIVE
Staphylococcus aureus: POSITIVE — AB

## 2010-06-13 LAB — TYPE AND SCREEN: Antibody Screen: NEGATIVE

## 2010-06-13 LAB — PROTIME-INR
INR: 0.93 (ref 0.00–1.49)
Prothrombin Time: 12.7 seconds (ref 11.6–15.2)

## 2010-06-13 LAB — ABO/RH: ABO/RH(D): A NEG

## 2010-06-14 ENCOUNTER — Inpatient Hospital Stay (HOSPITAL_COMMUNITY)
Admission: RE | Admit: 2010-06-14 | Discharge: 2010-06-16 | Payer: Self-pay | Source: Home / Self Care | Attending: Orthopedic Surgery | Admitting: Orthopedic Surgery

## 2010-06-15 LAB — CBC
HCT: 29.9 % — ABNORMAL LOW (ref 39.0–52.0)
MCHC: 33.1 g/dL (ref 30.0–36.0)
MCV: 87.9 fL (ref 78.0–100.0)
Platelets: 187 10*3/uL (ref 150–400)
RDW: 13.7 % (ref 11.5–15.5)
WBC: 11.1 10*3/uL — ABNORMAL HIGH (ref 4.0–10.5)

## 2010-06-15 LAB — PROTIME-INR: INR: 1.05 (ref 0.00–1.49)

## 2010-06-15 LAB — BASIC METABOLIC PANEL
BUN: 15 mg/dL (ref 6–23)
Calcium: 7.8 mg/dL — ABNORMAL LOW (ref 8.4–10.5)
GFR calc non Af Amer: >60 mL/min (ref >60)
Glucose, Bld: 144 mg/dL — ABNORMAL HIGH (ref 70–99)
Potassium: 3.7 mEq/L (ref 3.5–5.1)

## 2010-06-16 LAB — BASIC METABOLIC PANEL
BUN: 11 mg/dL (ref 6–23)
Calcium: 7.9 mg/dL — ABNORMAL LOW (ref 8.4–10.5)
Creatinine, Ser: 0.73 mg/dL (ref 0.4–1.5)
GFR calc Af Amer: >60 mL/min (ref >60)
GFR calc non Af Amer: >60 mL/min (ref >60)

## 2010-06-16 LAB — PROTIME-INR
INR: 1.09 (ref 0.00–1.49)
Prothrombin Time: 14.3 seconds (ref 11.6–15.2)

## 2010-06-16 LAB — CBC
HCT: 28.2 % — ABNORMAL LOW (ref 39.0–52.0)
MCHC: 33 g/dL (ref 30.0–36.0)
Platelets: 166 10*3/uL (ref 150–400)
RDW: 13.5 % (ref 11.5–15.5)
WBC: 10.6 10*3/uL — ABNORMAL HIGH (ref 4.0–10.5)

## 2010-06-16 NOTE — Op Note (Addendum)
Roberto Swanson, Roberto Swanson NO.:  [id number]  MEDICAL RECORD NO.:  [id number]          PATIENT TYPE:  INP  LOCATION:  5013                         FACILITY:  [facility]  PHYSICIAN:  Eulas Post, MD    DATE OF BIRTH:  Dec 08, 1955  DATE OF PROCEDURE:  06/14/2010 DATE OF DISCHARGE:                              OPERATIVE REPORT   PREOPERATIVE DIAGNOSIS:  Right hip osteoarthritis.  POSTOPERATIVE DIAGNOSIS:  Right hip osteoarthritis.  OPERATIVE PROCEDURE:  Right total hip arthroplasty.  ANESTHESIA:  General.  ESTIMATED BLOOD LOSS:  350 mL.  FIRST ASSISTANT:  Erskine Squibb B. Su Hilt, PA-C  OPERATIVE IMPLANTS:  DePuy Summit size 5 Press-Fit tapered hip stem with standard offset with a size 54-mm acetabular shell pinnacle Porocoat sector 2 with a single apex hole eliminator and a size 36-mm inner diameter pinnacle Marathon polyethylene lipped liner +4, 10 degrees with a ceramic Biolox Delta femoral head size +5 x 36 mm. PREOPERATIVE INDICATIONS:  Roberto Swanson is a 55 year old gentleman who had severe right hip osteoarthritis with stiffness and hypertrophic bone growth.  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  He elected to undergo right total hip arthroplasty.  The risks, benefits, and alternatives were discussed for the procedure including but not limited to risks of infection, bleeding, nerve injury, dislocation, leg length discrepancy, sciatic nerve palsy, blood clots, heterotopic ossification, blood transfusion, bleeding complications, cardiopulmonary complications, among others and he is willing to proceed.  We also discussed using indomethacin postoperatively in order to minimize his risk for heterotopic ossification.  We also discussed radiation, but he had a family member who died of a radiation-type induced sarcoma and he did not want to have radiation by any means.  We also discussed the risks that indomethacin could increase the  risk for bleeding given the fact that we have to anticoagulate postoperatively, however, nevertheless, this would be the optimal course of action given all things considered.  OPERATIVE PROCEDURE:  The patient was brought to the operating room and placed in supine position.  IV Ancef was given.  He did develop a little bit of a red splotchy chest during the positioning, and I was not sure whether or not he actually had an allergic reaction to Ancef or not. Nevertheless, I am going to given vancomycin postoperatively because of this questionable sensitivity.  A time-out was performed and the patient was turned in the lateral decubitus position.  General anesthesia was administered.  The right hip was towards the ceiling and all prominences were padded including an axillary roll.  The right lower extremity was prepped and draped in the usual sterile fashion.  Posterolateral approach to the hip was performed.  The piriformis and external rotators were tagged with #2 FiberWire and a T-type capsulotomy was performed.  There was substantial hypertrophic osteophyte formation.  The hip was dislocated and it had a very large osteophyte and head itself was fairly massive.  The deep retractors were placed and I exposed the acetabulum.  There was a substantial amount of the ossified labrum as well as heterotopic bone around the acetabulum.  This was removed using an  osteotome.  I then exposed the acetabulum and sequentially reamed.  I medialized first using a 42 reamer and then sequentially reamed up to prepare for a size 54 shell.  The  trial fit well and so I impacted the real implant into place into the appropriate version and inclination.  I had good bleeding bone, and I suspect that I could have medialized even more, however, I had an excellent shell that was capable of supporting the acetabulum and so therefore I stopped without going completely medial.  I still had even extensive osteophyte  that I removed even after placement of the shell using a rongeur.  This was both anterior and posterior.  Once the shell was in place, I placed a trial lipped liner and turned my attention to the femur.  The deep retractors were removed including the wing and the proximal femur exposed.  I used a cookie cutter followed by opening reamer and then lateralizing reamer and then sequentially reamed and then broached up to a size 5.  I then trialed and the +1.5 was a little too loose, although the stability was relatively good, but it was very easy to reduce and for that reason I elected to go with the +5.  This restored leg length better and the hip was extremely stable throughout functional range of motion.  There were even still yet more osteophytes that I removed anteriorly and posteriorly at this point in order to minimize impingement.  Once the final components had been selected, I placed the real femoral component and femoral head as well as the lipped liner.  The hip was extremely stable through a functional range of motion.  I irrigated the wounds copiously and repaired the capsule through drill holes in the greater trochanter using #2 FiberWire.  Excellent posterior closure was achieved.  I irrigated it again once more and then repaired the fascia with #1 followed by 2-0 and 4-0 for the skin.  Steri-Strips and sterile gauze was applied.  He was awakened and turned supine and returned to PACU in stable and satisfactory condition.  Roberto Swanson was present and scrubbed and critical throughout the case for retraction, dislocation, and reduction maneuvers, closure, and positioning.  There were no complications and he tolerated the procedure well.     Eulas Post, MD     JPL/MEDQ  D:  06/14/2010  T:  06/14/2010  Job:  [id number]  Electronically Signed by Teryl Lucy MD on 06/16/2010 05:30:57 PM

## 2010-06-20 ENCOUNTER — Encounter: Payer: Self-pay | Admitting: Internal Medicine

## 2010-06-21 DIAGNOSIS — Z96641 Presence of right artificial hip joint: Secondary | ICD-10-CM | POA: Insufficient documentation

## 2010-06-21 NOTE — Progress Notes (Signed)
Summary: Roberto Swanson  Phone Note Other Incoming   Caller: lab Summary of Call: sent add on slip to lab for lipid and a1c---karen called from lab stating the a1c could not be added---pt will have to come in and have labs redone/vg Initial call taken by: Tora Perches,  July 09, 2009 8:56 AM  Follow-up for Phone Call        noted. Thx Follow-up by: Tresa Garter MD,  July 09, 2009 1:36 PM
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
## 2010-06-21 NOTE — Progress Notes (Signed)
Summary: add on lab  Phone Note Other Incoming   Summary of Call: Needs A1c and lipids 790.29  272.2 added to current labs pls Initial call taken by: Tresa Garter MD,  July 08, 2009 1:30 PM  Follow-up for Phone Call        add on sheet faxed to lab at [phone] Follow-up by: Rock Nephew CMA,  July 08, 2009 3:40 PM

## 2010-06-21 NOTE — Progress Notes (Signed)
  Phone Note Refill Request Message from:  Fax from Pharmacy on September 13, 2009 1:00 PM  Refills Requested: Medication #1:  LIPITOR 20 MG  TABS Take one tablet once daily Initial call taken by: Rock Nephew CMA,  September 13, 2009 1:00 PM    Prescriptions: LIPITOR 20 MG  TABS (ATORVASTATIN CALCIUM) Take one tablet once daily  #30 x 11   Entered by:   Rock Nephew CMA   Authorized by:   Tresa Garter MD   Signed by:   Rock Nephew CMA on 09/13/2009   Method used:   Electronically to        [facility]* (retail)       [address]       [place], [place]  [id number]       Ph: [phone]       Fax: [phone]   RxID:   [id number]

## 2010-06-23 NOTE — Assessment & Plan Note (Signed)
Summary: Cardiology Nuclear Testing  Nuclear Med Background Indications for Stress Test: Evaluation for Ischemia, Surgical Clearance  Indications Comments: Pending THR 06/14/10 by Dr. Teryl Lucy   History Comments: No documented CAD  Symptoms: DOE    Nuclear Pre-Procedure Cardiac Risk Factors: History of Smoking, Hypertension, Lipids, Obesity Caffeine/Decaff Intake: None NPO After: 8:30 PM Lungs: Clear IV 0.9% NS with Angio Cath: 20g     IV Site: R Antecubital IV Started by: Irean Hong, RN Chest Size (in) 44     Height (in): 71 Weight (lb): 234 BMI: 32.75  Nuclear Med Study 1 or 2 day study:  1 day     Stress Test Type:  Treadmill/Lexiscan Reading MD:  Cassell Clement, MD     Referring MD:  Sonda Primes, MD Resting Radionuclide:  Technetium 98m Tetrofosmin     Resting Radionuclide Dose:  11 mCi  Stress Radionuclide:  Technetium 22m Tetrofosmin     Stress Radionuclide Dose:  33 mCi   Stress Protocol Exercise Time (min):  6:45 min     Max HR:  131 bpm     Predicted Max HR:  165 bpm  Max Systolic BP: 168 mm Hg     Percent Max HR:  79.39 %     METS: 5.6 Rate Pressure Product:  16109    Stress Test Technologist:  [employer], CMA-N     Nuclear Technologist:  Domenic Polite, CNMT  Rest Procedure  Myocardial perfusion imaging was performed at rest 45 minutes following the intravenous administration of Technetium 49m Tetrofosmin.  Stress Procedure  Patient initially walked the treadmill utilizing the Bruce protocol, but was unable to get his heart rate up due to right hip pain and hypertensive response, 154/110.  He then received IV Lexiscan 0.4 mg over 15-seconds with concurrent low level exercise and then Technetium 35m Tetrofosmin was injected at 30-seconds.  There were no significant changes with infusion and he denied any chest pain.  Quantitative spect images were obtained after a 45 minute delay.  QPS Raw Data Images:  Normal; no motion artifact; normal  heart/lung ratio. Stress Images:  Normal homogeneous uptake in all areas of the myocardium. Rest Images:  Normal homogeneous uptake in all areas of the myocardium. Subtraction (SDS):  No evidence of ischemia. Transient Ischemic Dilatation:  .95  (Normal <1.22)  Lung/Heart Ratio:  .28  (Normal <0.45)  Quantitative Gated Spect Images QGS EDV:  131 ml QGS ESV:  48 ml QGS EF:  63 % QGS cine images:  Normal wall motion  Findings Normal nuclear study      Overall Impression  Exercise Capacity: Poor exercise capacity.  Converted to Lexiscan --hip pain. BP Response: Hypertensive blood pressure response. Clinical Symptoms: No chest pain ECG Impression: No significant ST segment change suggestive of ischemia. Overall Impression: Normal stress nuclear study.  Appended Document: Cardiology Nuclear Testing Misty Stanley, please inform - heart stress test was nl  Thanks, AP   Appended Document: Cardiology Nuclear Testing left detailed mess informing pt of above

## 2010-06-23 NOTE — Assessment & Plan Note (Signed)
Summary: 6 mos f/u/#/cd   Vital Signs:  Patient profile:   55 year old male Height:      71 inches Weight:      235 pounds BMI:     32.89 Temp:     98.8 degrees F oral Pulse rate:   72 / minute Pulse rhythm:   regular Resp:     16 per minute BP sitting:   130 / 82  (left arm) Cuff size:   large  Vitals Entered By: Lanier Prude, Beverly Gust) (May 18, 2010 7:58 AM) CC: CPX Is Patient Diabetic? No   CC:  CPX.  History of Present Illness: Req byDr Dion Saucier IM consult Reason: THR R on Jan, 24  2012 Hx: 55 yo with hip depression, HTN, dyslipidemia - all stable.  Current Medications (verified): 1)  Wellbutrin Sr 150 Mg  Tb12 (Bupropion Hcl) .... Take One Tablet Twice Daily 2)  Vitamin-B Complex   Tabs (B Complex Vitamins) .Marland Kitchen.. 1 Qd 3)  Vitamin D3 1000 Unit  Tabs (Cholecalciferol) .Marland Kitchen.. 1 Qd 4)  Aspirin 81 Mg  Tabs (Aspirin) .... Take One Tablet Once Daily 5)  Celexa 10 Mg Tabs (Citalopram Hydrobromide) .Marland Kitchen.. 1 1/2 Once Daily 6)  Triamcinolone Acetonide 0.5 % Crea (Triamcinolone Acetonide) .... Use Two Times A Day Prn 7)  Vimovo 500-20 Mg Tbec (Naproxen-Esomeprazole) .Marland Kitchen.. 1 By Mouth Once Daily - Two Times A Day Pc As Needed Pain 8)  Lipitor 40 Mg Tabs (Atorvastatin Calcium) .Marland Kitchen.. 1 By Mouth Qd  Allergies (verified): No Known Drug Allergies  Past History:  Past Medical History: Last updated: 07/15/2009 Hx of FRACTURE, LEG, LEFT (ICD-827.0) 2006 HYPERLIPIDEMIA (ICD-272.4), elev. TG Elev. BP  Elev glucose Depression Dr Nolen Mu Osteoarthritis R hip hand eczema  Past Surgical History: TONSILLECTOMY, HX OF (ICD-V45.79) L leg fracture repair     Family History: M rhem. fever DM F CAD and he had AVR  Social History: Occupation: Financial controller Single Alcohol use-yes - 1 glass twice a week Regular exercise-yes  Review of Systems       The patient complains of difficulty walking.  The patient denies anorexia, fever, weight loss, weight gain, vision  loss, decreased hearing, hoarseness, chest pain, syncope, dyspnea on exertion, peripheral edema, prolonged cough, headaches, hemoptysis, abdominal pain, melena, hematochezia, severe indigestion/heartburn, hematuria, incontinence, genital sores, muscle weakness, suspicious skin lesions, transient blindness, depression, unusual weight change, abnormal bleeding, enlarged lymph nodes, angioedema, and testicular masses.    Physical Exam  General:  overweight-appearing.   Head:  Normocephalic and atraumatic without obvious abnormalities. No apparent alopecia or balding. Eyes:  No corneal or conjunctival inflammation noted. EOMI. Perrla. Ears:  External ear exam shows no significant lesions or deformities.  Otoscopic examination reveals clear canals, tympanic membranes are intact bilaterally without bulging, retraction, inflammation or discharge. Hearing is grossly normal bilaterally. Nose:  External nasal examination shows no deformity or inflammation. Nasal mucosa are pink and moist without lesions or exudates. Mouth:  Oral mucosa and oropharynx without lesions or exudates.  Teeth in good repair. Neck:  No deformities, masses, or tenderness noted. Chest Wall:  No deformities, masses, tenderness or gynecomastia noted. Lungs:  Normal respiratory effort, chest expands symmetrically. Lungs are clear to auscultation, no crackles or wheezes. Heart:  Normal rate and regular rhythm. S1 and S2 normal without gallop, murmur, click, rub or other extra sounds. Abdomen:  Bowel sounds positive,abdomen soft and non-tender without masses, organomegaly or hernias noted. Rectal:  WNL Stool is G(-) Genitalia:  nl Prostate:  1+ enlarged.   Msk:  Lumbar-sacral spine is tender to palpation over paraspinal muscles and painfull with the ROM  R hip pain w/rom Pulses:  R and L carotid,radial,femoral,dorsalis pedis and posterior tibial pulses are full and equal bilaterally Extremities:  No clubbing, cyanosis, edema, or  deformity noted with normal full range of motion of all joints.   Neurologic:  No cranial nerve deficits noted. Station and gait are normal. Plantar reflexes are down-going bilaterally. DTRs are symmetrical throughout. Sensory, motor and coordinative functions appear intact. Skin:  WNL Cervical Nodes:  No lymphadenopathy noted Inguinal Nodes:  No significant adenopathy Psych:  Cognition and judgment appear intact. Alert and cooperative with normal attention span and concentration. No apparent delusions, illusions, hallucinations   Impression & Recommendations:  Problem # 1:  PREOPERATIVE EXAMINATION (ICD-V72.84) Assessment New He should be medically clear for surgery assuming the tests come back acceptable. Will get a CL to r/o CAD. The labs were reviewed with the patient. EKG was OK. Thank you! Orders: Cardiolite (Cardiolite)  Problem # 2:  OSTEOARTHRITIS (ICD-715.90) R hip Assessment: Deteriorated THR is pending  His updated medication list for this problem includes:    Aspirin 81 Mg Tabs (Aspirin) .Marland Kitchen... Take one tablet once daily    Vimovo 500-20 Mg Tbec (Naproxen-esomeprazole) .Marland Kitchen... 1 by mouth once daily - two times a day pc as needed pain  Problem # 3:  HYPERTENSION (ICD-401.1) on diet Assessment: Improved  BP today: 130/82 Prior BP: 146/98 (11/12/2009)  Labs Reviewed: K+: 4.2 (05/12/2010) Creat: : 0.7 (05/12/2010)   Chol: 194 (05/12/2010)   HDL: 40.10 (05/12/2010)   LDL: 94 (07/08/2009)   TG: 346.0 (05/12/2010)  Problem # 4:  HYPERLIPIDEMIA (ICD-272.4) Assessment: Improved  His updated medication list for this problem includes:    Lipitor 40 Mg Tabs (Atorvastatin calcium) .Marland Kitchen... 1 by mouth qd  Orders: Cardiolite (Cardiolite)  Problem # 5:  DEPRESSION (ICD-311) Assessment: Improved  His updated medication list for this problem includes:    Wellbutrin Sr 150 Mg Tb12 (Bupropion hcl) .Marland Kitchen... Take one tablet twice daily    Celexa 10 Mg Tabs (Citalopram hydrobromide)  .Marland Kitchen... 1 1/2 once daily  Complete Medication List: 1)  Wellbutrin Sr 150 Mg Tb12 (Bupropion hcl) .... Take one tablet twice daily 2)  Vitamin-b Complex Tabs (B complex vitamins) .Marland Kitchen.. 1 qd 3)  Vitamin D3 1000 Unit Tabs (Cholecalciferol) .Marland Kitchen.. 1 qd 4)  Aspirin 81 Mg Tabs (Aspirin) .... Take one tablet once daily 5)  Celexa 10 Mg Tabs (Citalopram hydrobromide) .Marland Kitchen.. 1 1/2 once daily 6)  Triamcinolone Acetonide 0.5 % Crea (Triamcinolone acetonide) .... Use two times a day prn 7)  Vimovo 500-20 Mg Tbec (Naproxen-esomeprazole) .Marland Kitchen.. 1 by mouth once daily - two times a day pc as needed pain 8)  Lipitor 40 Mg Tabs (Atorvastatin calcium) .Marland Kitchen.. 1 by mouth qd  Other Orders: EKG w/ Interpretation (93000) Flu Vaccine 50yrs + (16109) Admin 1st Vaccine (60454)  Patient Instructions: 1)  Please schedule a follow-up appointment in 6 months. 2)  BMP prior to visit, ICD-9: 3)  Hepatic Panel prior to visit, ICD-9: 4)  Lipid Panel prior to visit, ICD-9:272.20  995.20   Orders Added: 1)  EKG w/ Interpretation [93000] 2)  Flu Vaccine 71yrs + [90658] 3)  Admin 1st Vaccine [90471] 4)  Cardiolite [Cardiolite] 5)  Consultation Level IV [09811]   Immunizations Administered:  Influenza Vaccine:    Vaccine Type: Fluvax 3+    Site: right deltoid    Mfr: Sanofi  Pasteur    Dose: 0.5 ml    Route: IM    Given by: Lanier Prude, CMA(AAMA)    Exp. Date: 11/19/2010    Lot #: [id number]    VIS given: 12/14/09 version given May 18, 2010.   Immunizations Administered:  Influenza Vaccine:    Vaccine Type: Fluvax 3+    Site: right deltoid    Mfr: Sanofi Pasteur    Dose: 0.5 ml    Route: IM    Given by: Lanier Prude, CMA(AAMA)    Exp. Date: 11/19/2010    Lot #: [id number]    VIS given: 12/14/09 version given May 18, 2010.

## 2010-06-23 NOTE — Progress Notes (Signed)
Summary: nuc pre procedure  Phone Note Outgoing Call Call back at Home Phone [phone]   Call placed by: Cathlyn Parsons RN,  May 24, 2010 2:29 PM Call placed to: Patient Reason for Call: Confirm/change Appt Summary of Call: Reviewed information on [employer] Information Sheet (see scanned document for further details).  Spoke with patient.      Nuclear Med Background Indications for Stress Test: Evaluation for Ischemia, Surgical Clearance  Indications Comments: Pre op for THR ib 06/14/10 with Dr.Landau       Nuclear Pre-Procedure Cardiac Risk Factors: Hypertension, Lipids, Obesity Height (in): 71

## 2010-06-24 NOTE — Discharge Summary (Signed)
  NAMELINDWOOD, MOGEL NO.:  [id number]  MEDICAL RECORD NO.:  [id number]          PATIENT TYPE:  INP  LOCATION:  5013                         FACILITY:  [facility]  PHYSICIAN:  Eulas Post, MD    DATE OF BIRTH:  1955-12-12  DATE OF ADMISSION:  06/14/2010 DATE OF DISCHARGE:  06/16/2010                              DISCHARGE SUMMARY   ADMISSION DIAGNOSIS:  Right hip osteoarthritis.  DISCHARGE DIAGNOSIS:  Right hip osteoarthritis.  PRIMARY PROCEDURE:  Right total hip arthroplasty.  DISCHARGE MEDICATIONS:  Lovenox as well as indomethacin for heterotopic ossification prevention.  I will not use Coumadin due to the duration of reversibility required should there be an issue with the coexisting use of anticoagulation and indomethacin.  HOSPITAL COURSE:  Roberto Swanson is a 55 year old gentleman who elected for right total hip arthroplasty.  He has a history of heterotopic ossification.  He elected to undergo right total hip replacement, and he tolerated the procedure well and postoperatively did not have any complications.  He was given Lovenox and sequential compression devices for DVT prophylaxis.  He also had early ambulation.  He was actually out of bed on the evening of surgery.  He was able to walk with physical therapy and his pain was adequately controlled on p.o. analgesics.  He was tolerating regular diet.  He was passing gas, and we will work on having a bowel movement today.  He is planned to be discharged home with followup with me in 2 weeks.  His dressings were changed on postoperative day 2, and his wounds were clean and dry.  He benefited maximally from this hospital stay and will follow up in 2 weeks.  He will have posterior hip precautions.     Eulas Post, MD     JPL/MEDQ  D:  06/16/2010  T:  06/16/2010  Job:  [id number]  Electronically Signed by Teryl Lucy MD on 06/24/2010 05:17:58 PM

## 2010-06-27 ENCOUNTER — Encounter: Payer: Self-pay | Admitting: Internal Medicine

## 2010-07-07 NOTE — Miscellaneous (Signed)
Summary: Medication Order/[facility]  Medication Order/[facility]   Imported By: Sherian Rein 06/29/2010 12:17:29  _____________________________________________________________________  External Attachment:    Type:   Image     Comment:   External Document

## 2010-07-13 NOTE — Letter (Signed)
Summary: [facility]  [facility]   Imported By: Sherian Rein 07/06/2010 09:24:06  _____________________________________________________________________  External Attachment:    Type:   Image     Comment:   External Document

## 2010-07-14 ENCOUNTER — Telehealth: Payer: Self-pay | Admitting: Internal Medicine

## 2010-07-19 NOTE — Progress Notes (Signed)
Summary: pre med  Phone Note Call from Patient Call back at [facility] Phone [phone]   Summary of Call: Pt is 4 wks out from total hip replacement. He has upcomming apt for dental cleaning. Does he need premed for antibiotic?  Initial call taken by: Lamar Sprinkles, CMA,  July 14, 2010 11:05 AM  Follow-up for Phone Call        best to ask his ortho i would use Amoxicillin 500 mg 4 caps 1 h prior Follow-up by: Tresa Garter MD,  July 14, 2010 3:43 PM  Additional Follow-up for Phone Call Additional follow up Details #1::        Left vm on pts hm # to call ortho and as if med was needed. IF yes to call office back and we would send in RX.  Additional Follow-up by: Lamar Sprinkles, CMA,  July 14, 2010 5:32 PM

## 2010-07-25 ENCOUNTER — Encounter: Payer: Self-pay | Admitting: Internal Medicine

## 2010-07-27 ENCOUNTER — Ambulatory Visit: Payer: Self-pay | Admitting: Licensed Clinical Social Worker

## 2010-08-01 ENCOUNTER — Ambulatory Visit (INDEPENDENT_AMBULATORY_CARE_PROVIDER_SITE_OTHER): Payer: Managed Care, Other (non HMO) | Admitting: Licensed Clinical Social Worker

## 2010-08-01 DIAGNOSIS — F4323 Adjustment disorder with mixed anxiety and depressed mood: Secondary | ICD-10-CM

## 2010-08-09 NOTE — Letter (Signed)
Summary: [facility]  [facility]   Imported By: Sherian Rein 08/03/2010 12:34:28  _____________________________________________________________________  External Attachment:    Type:   Image     Comment:   External Document

## 2010-08-15 ENCOUNTER — Ambulatory Visit (INDEPENDENT_AMBULATORY_CARE_PROVIDER_SITE_OTHER): Payer: Managed Care, Other (non HMO) | Admitting: Licensed Clinical Social Worker

## 2010-08-15 DIAGNOSIS — F4323 Adjustment disorder with mixed anxiety and depressed mood: Secondary | ICD-10-CM

## 2010-08-29 ENCOUNTER — Ambulatory Visit: Payer: Managed Care, Other (non HMO) | Admitting: Licensed Clinical Social Worker

## 2010-08-31 ENCOUNTER — Ambulatory Visit (INDEPENDENT_AMBULATORY_CARE_PROVIDER_SITE_OTHER): Payer: Managed Care, Other (non HMO) | Admitting: Licensed Clinical Social Worker

## 2010-08-31 DIAGNOSIS — F4323 Adjustment disorder with mixed anxiety and depressed mood: Secondary | ICD-10-CM

## 2010-09-14 ENCOUNTER — Ambulatory Visit (INDEPENDENT_AMBULATORY_CARE_PROVIDER_SITE_OTHER): Payer: Managed Care, Other (non HMO) | Admitting: Licensed Clinical Social Worker

## 2010-09-14 DIAGNOSIS — F4323 Adjustment disorder with mixed anxiety and depressed mood: Secondary | ICD-10-CM
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
## 2010-10-07 NOTE — Op Note (Signed)
[name], Roberto Swanson NO.:  [id number]   MEDICAL RECORD NO.:  [id number]          PATIENT TYPE:  INP   LOCATION:  5007                         FACILITY:  [facility]   PHYSICIAN:  Claude Manges. Whitfield, M.D.DATE OF BIRTH:  1956-02-11   DATE OF PROCEDURE:  03/11/2005  DATE OF DISCHARGE:                                 OPERATIVE REPORT   PREOPERATIVE DIAGNOSIS:  Comminuted, displaced, bicondylar left proximal  tibial plateau fracture, closed, with associated fracture of fibular head.   POSTOPERATIVE DIAGNOSIS:  Comminuted, displaced, bicondylar left proximal  tibial plateau fracture, closed, with associated fracture of fibular head.   PROCEDURES:  1.  Application of spanning external fixator left lower extremity.  2.  Closed reduction of tibial plateau fracture.  3.  Compartment pressure measurement.   SURGEON:  Dr. Cleophas Dunker.   ASSISTANT:  Rexene Edison, P.A.C.   ANESTHESIA:  General orotracheal.   COMPLICATIONS:  None.   PROCEDURE:  With the patient comfortable on the operating table and under  general orotracheal anesthesia, a tourniquet was applied to the left lower  extremity.  The left leg was then prepped from the tourniquet to the tips of  the toes with Betadine scrub and then DuraPrep.  Sterile draping was  performed.   The patient had a significant hemarthrosis.  We aspirated his knee on at  least two occasions in the operating room and twice in the emergency room.  We removed several hundred mL of blood with small fat globules.  He had  already developed some early fracture blisters along the proximal tibia,  particularly anteriorly.  The ace external fixator was utilized.  Two 5.0  pins were placed in the anterolateral femur distally.  Small puncture sites  were made.  A drill cannula was inserted to protect soft tissue and we  predrilled the holes with the appropriate drill and then inserted the pins  and were noted to be in excellent position with  imagine intensification  through both cortices.  The carbon fiber external fixator was then applied  and we obtained the appropriate templates to insert the two distal pins in  the anterior medial tibia.  Small puncture sites were made, in a similar  fashion the holes were predrilled and then filled with the self tapping 5-0  pins.  We again checked them to be sure we were through both cortices on the  image intensification.  The external fixator was then applied.  Traction was  applied to the fracture and we checked it on both AP and lateral projection,  felt we had excellent position compared to the preoperative status, which  was significantly impacted and comminuted.  The external fixator was  tightened and we again checked the images to be sure we were intact.   The patient's compartments appeared to be supple, but there was some  swelling so we measured the compartments anteriorly and posteriorly and it  was between 38 and 40 mmHg, the diastolic pressure was approximately 71, and  with a clinical evidence of supple compartments we felt that fasciotomies  were not necessary.  The patient had good  pulses, normal capillary refill to  the toes and no pain with passive motion of the toes nor was there any loss  of sensibility preoperatively.   The leg was then cleaned, we placed Xeroform gauze around the pin tracks.  They were covered with sterile gauze and very loosely wrapped Kling.   Patient tolerated the procedure without complications.  He was returned to  the Post Anesthesia Recovery Room in satisfactory condition.      Claude Manges. Cleophas Dunker, M.D.  Electronically Signed     PWW/MEDQ  D:  03/11/2005  T:  03/12/2005  Job:  [id number]
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
## 2010-10-07 NOTE — Op Note (Signed)
NAMEMIKOLAJ, WOOLSTENHULME NO.:  [id number]   MEDICAL RECORD NO.:  [id number]          PATIENT TYPE:  INP   LOCATION:  5007                         FACILITY:  [facility]   PHYSICIAN:  Claude Manges. Whitfield, M.D.DATE OF BIRTH:  May 25, 1955   DATE OF PROCEDURE:  03/11/2005  DATE OF DISCHARGE:                                 OPERATIVE REPORT   PREOPERATIVE DIAGNOSIS:  Early compartment syndrome, left lower extremity.   POSTOPERATIVE DIAGNOSIS:  Early compartment syndrome, left lower extremity.   PROCEDURE:  Four compartment fasciotomy left lower extremity.   SURGEON:  Claude Manges. Cleophas Dunker, M.D.   ASSISTANT:  Richardean Canal, P.A.-C.   ANESTHESIA:  General orotracheal anesthesia.   COMPLICATIONS:  None.   HISTORY:  A 55 year old gentleman who is approximately seven hours status  post application of an external fixator to the left lower extremity for a  badly comminuted and displaced bicondylar closed left tibial plateau  fracture.  Over the last seven hours, he has developed increasing fracture  blasters and increased firmness of his calf.  He has good capillary refill  and normal sensibility, but I was concerned about early compartment syndrome  and thus feel that four compartment fasciotomy is indicated.   DESCRIPTION OF PROCEDURE:  The patient comfortable on the operating table.  Under general orotracheal anesthesia, the right lower extremity was prepped  with DuraPrep from the knee to the tips of the toes.  The previously applied  external fixator was isolated with sterile drapes.  A longitudinal incision  approximately 12 cm in length was made half way between the fibula and the  anterior crust of the tibia laterally via sharp dissection.  Incision was  carried down to the subcutaneous tissues.  Quite a bit of pressure within  the small venous bleeders.  These were Bovie coagulated.  Nice dry feel.  By  finger dissection, I was able to find the lateral and the anterior  compartment. And fasciotomies were drawn to direct visualization.  I was  able to extend the fasciotomies with a gloved finger proximally and  distally.  The muscle was nice and viable.  I had good bleeding.  There was  some bulging of the muscle particularly in the anterior compartment.  The  wound was then packed with saline.   Longitudinal incision approximately 8 cm in length was then made medially,  several centimeters medial to the anterior border of the tibia and  approximately 14 cm distance from the lateral incision.  Via sharp  dissection, incision was carried down to the subcutaneous tissue with gross  bleeders Bovie coagulated.  The sural nerve was identified.  The saphenous  vein and nerve were identified and carefully retracted.  The superficial  fascia was then incised under direct visualization, extended with a gloved  finger proximally and distally.  I was able to retract the gastroc soleus  musculature and palpate the deep posterior compartment and release the  fascia.  I had a very nice release and the muscle was nice and viable.  The  wound was then irrigated with saline.  Vacuum sponge dressings  were  then applied with an excellent seal.  The VAC systems were then applied  with good suction.  Sterile bulky dressing was applied.  Patient was then  returned to the post anesthesia recovery room in satisfactory condition.  There was significantly decreased firmness and increased suppleness of the  compartments.      Claude Manges. Cleophas Dunker, M.D.  Electronically Signed     PWW/MEDQ  D:  03/11/2005  T:  03/12/2005  Job:  [id number]

## 2010-10-07 NOTE — Op Note (Signed)
[name], [name]:  [id number]   MEDICAL RECORD NO.:  [id number]          PATIENT TYPE:  INP   LOCATION:  5731                         FACILITY:  [facility]   PHYSICIAN:  Doralee Albino. Carola Frost, M.D. DATE OF BIRTH:  Dec 19, 1955   DATE OF PROCEDURE:  04/04/2005  DATE OF DISCHARGE:                                 OPERATIVE REPORT   PREOPERATIVE DIAGNOSES:  1.  Left proximal tibia bicondylar plateau fracture.  2.  Spanning external fixator.   POSTOPERATIVE DIAGNOSES:  1.  Left proximal tibia bicondylar plateau fracture.  2.  Spanning external fixator.  3.  Longitudinal tear of the lateral meniscus involving anterior horn and      midbody.   PROCEDURES:  1.  ORIF left tibial bicondylar plateau fracture.  2.  Removing of spanning external fixator under general anesthesia.  3.  Repair of left lateral meniscus through an arthrotomy.   SURGEON:  Doralee Albino. Carola Frost, M.D.   ASSISTANT:  Cecil Cranker, PA.   ANESTHESIA:  General.   COMPLICATIONS:  None.   TOTAL TOURNIQUET TIME:  120 minutes.   ESTIMATED OF BLOOD LOSS:  700 mL.   DRAINS:  One medium Hemovac.   SPECIMENS:  None.   DISPOSITION:  To PACU.   CONDITION:  Stable.   BRIEF SUMMARY OF INDICATION FOR PROCEDURE:  Roberto Swanson is a 55 year old  male who sustained a severe proximal tibia fracture several weeks ago which  was complicated by the development of compartment syndrome. He was treated  initially with spanning external fixation for his bicondylar tibial plateau  fracture as well as fasciotomies.  Subsequently, his fasciotomies were able  to be closed and he did also require revision adjustment of his fixator. He  was followed in the clinic for resolution of soft tissue swelling that would  permit definitive fixation. He has been on Lovenox for DVT prophylaxis. Of  note, the patient also had a partial peroneal nerve palsy with lack of deep  peroneal sensation and also inability to extend  the great toe and ankle.  After a complete discussion of risks and benefits of surgery including  possibility of deep and superficial infection, nerve injury, vessel injury,  malunion, nonunion, arthritis, restricted range of motion and  thromboembolism among other complications, the patient wished to proceed.   DESCRIPTION OF PROCEDURE:  Roberto Swanson was administered preoperative Ancef,  taken to the operating room where his left lower extremity was prepped and  draped in the usual sterile fashion.  After induction of general anesthesia,  the external fixator was removed and the remaining fracture blister, dead  skin and resolved bulla were removed.  Numerous chlorhexidine scrub brushes  were then used to thoroughly scrubbed the extremity. Standard prep and drape  using of Betadine scrub and paint was then performed.  The external fixator  pin sites were then debrided both at the soft tissue level and at the near  cortex with curettes. These were then passed off the table, lavage performed  and the pin sites as superiorly covered with Raytex and Ioban.  A tourniquet  was then placed  about the thigh but not inflated at that time. We then  pursued provisional internal fixation of the medial plateau.   This was performed with three 2.0  anterior to posterior guide pins to  prevent any displacement of this well aligned portion of the plateau and to  keep it intact as the block.  A standard AO incision was then made laterally  with extension of the incision being performed carefully toward the proximal  aspect of the wound to avoid any injury to the peroneal nerve.  There were  multiple comminuted segments of the lateral cortex, some of which had  attachments to the capsule. The meniscus was then elevated using a  transverse incision of the coronary ligament at its base along the proximal  aspect of the lateral plateau. It was already partially detached.  Elevation  revealed a longitudinal  tear in the lateral meniscus. A Prolene suture was  passed around this portion of the meniscus using a vertical mattress  technique and another suture was also passed in the edge of the meniscus to  allow elevation. This demonstrated marked depression of the articular  surface with some comminution posteriorly. There were multiple small  fragments along the rim, but surprisingly there was a large segment of  articular surface with intact subchondral bone that made up the bulk of the  lateral plateau.  This was elevated by hinging out the lateral segments  attached to the capsule and elevating it with a bone tamp.  Two K-wires were  then driven through this segment and pulled out the medial side to allow the  lateral cortical segments to be closed over the elevated portion and reduced  portion of the plateau.  After obtaining provisional fixation of the  articular block, more K-wires were placed both lateral to medial as well as  front to back.   Attention was then turned to the medial side, where a standard posterior  medial approach to the plateau was performed, staying posterior and deep to  the pes tendons. The fracture site was identified and a longitudinal  incision made along the periosteum at the level of the fracture to expose  the segments.  No significant amount of elevation was performed here and  most periosteal attachments were left intact.  After medial exposure, the  proper length plate was contoured and left in the medial wound. Attention  was then turned back to the lateral side where the proper length plate was  selected and positioned along the lateral cortex. We made every attempt to  obtain the correct alignment.  A King Kong clamp was placed through the  plate and at the medial subchondral surface. A cancellus screw was then used  to lag the joint surface together and then additional locked screws were placed.  The tibial tubercle was then reduced using a sharp  tenaculum  through a stab hole anteriorly to bring the tubercle more distally and to  translate the shaft laterally. This was then secured with a 3.5 lag screw.  Additional 3.5 lag screws were then placed anterior to posterior more  proximally.  The reduction was adjusted to allow for appropriate rotational  alignment of the tibia given the severe comminution in the metadiaphysis.  The plate was then securely fixed to the shaft with again adjustment of the  alignment using two standard cortical screws. We then secured the medial  plate using two cortical screws into the shaft to obtain a buttressing  effect of the medial  plateau and then one locked screw into that segment to  maintain its position and provide support to the medial column. An  additional a cortical screw was placed in the distal aspect of the plate.  This resulted in excellent control and compression of the medial segment.  Laterally, we then placed two locked screws into the shaft as well as a  lateral to medial corner screw through the plate.  The lateral tibial  plateau continued to appear somewhat depressed, and given the large  articular segment on that side, I felt that this could be improved somewhat.  Consequently, the locked screws were withdrawn, saving the most anterior one  where the proper height and reduction could be gauged through arthrotomy.  The tamp was once more introduced and used to elevate the posterior aspect  and middle aspect of the lateral plateau while using fixation that we had  obtained to applied a varus stress. After this segment was elevated, it was  held with a 2.0 guide pin. The subchondral and articular block was proximal  enough that though it was supported with the rafter of locked screws,  was  not all that well engaged with it.  Consequently, K-wire was left in place  and used as part of the augmentation of our fixation. This resulted in much  improved position of the lateral plateau.  The screws were once more inserted  achieving again excellent compression through the cancellus screw as well as  the other two locked screws. Final AP and lateral images showed appropriate  reduction and hardware position.  We then used 40 mL of cancellus bone chips  as well as 10 mL of DBX to pack into the areas of bone deficiency  anteriorly, laterally and posteromedially, rather than calcium phosphate  cement as the defect was not contained.  A medium Hemovac drain was placed  and then wounds closed in standard layer fashion with 2-0 Vicryl and nylon.  Sterile gently compressive dressing and an knee immobilizer were applied as  well as a night splinting control ankle equinus. The patient was then taken  to the PACU in stable condition. It should be noted that the knee was  examined following all of our fixation and was stable to varus valgus force  in full extension.  PROGNOSIS:  Roberto Swanson has sustained a severely comminuted fracture of his  proximal tibial plateau with significant joint injury.  At this time his  alignment appears to have been restored.  His knee is stable to varus-valgus  forces in full extension and his meniscus has been repaired and preserved.  His articular reduction actually appears appropriate as well,  At this time,  Roberto Swanson may go on to have a good result and not require further surgery.  He had an extensive amount of bone loss from his initial injury secondary to  comminution and we are hopeful that with the allografting, he will not  require further surgery for nonunion. He remains at risk for infection,  particularly given his combined compartment syndrome, external fixator and  internal fixation. He will remain on DVT prophylaxis.  We will switch him to  Coumadin at this time and continue to watch him closely.      Doralee Albino. Carola Frost, M.D.  Electronically Signed     [facility]  D:  04/04/2005  T:  04/05/2005  Job:  [id number]

## 2010-11-08 ENCOUNTER — Other Ambulatory Visit: Payer: Self-pay

## 2010-11-08 ENCOUNTER — Other Ambulatory Visit: Payer: Self-pay | Admitting: Internal Medicine

## 2010-11-08 DIAGNOSIS — T887XXA Unspecified adverse effect of drug or medicament, initial encounter: Secondary | ICD-10-CM

## 2010-11-08 DIAGNOSIS — E782 Mixed hyperlipidemia: Secondary | ICD-10-CM

## 2010-11-11 ENCOUNTER — Other Ambulatory Visit (INDEPENDENT_AMBULATORY_CARE_PROVIDER_SITE_OTHER): Payer: Managed Care, Other (non HMO)

## 2010-11-11 ENCOUNTER — Other Ambulatory Visit: Payer: Self-pay | Admitting: Internal Medicine

## 2010-11-11 DIAGNOSIS — E782 Mixed hyperlipidemia: Secondary | ICD-10-CM

## 2010-11-11 DIAGNOSIS — T887XXA Unspecified adverse effect of drug or medicament, initial encounter: Secondary | ICD-10-CM

## 2010-11-11 LAB — HEPATIC FUNCTION PANEL
AST: 24 U/L (ref 0–37)
Albumin: 4.7 g/dL (ref 3.5–5.2)
Total Bilirubin: 0.8 mg/dL (ref 0.3–1.2)

## 2010-11-11 LAB — LIPID PANEL
Cholesterol: 172 mg/dL (ref 0–200)
HDL: 40.7 mg/dL (ref >39.00)
Total CHOL/HDL Ratio: 4
VLDL: 46.6 mg/dL — ABNORMAL HIGH (ref 0.0–40.0)

## 2010-11-11 LAB — BASIC METABOLIC PANEL
BUN: 19 mg/dL (ref 6–23)
Calcium: 9.3 mg/dL (ref 8.4–10.5)
GFR: 119.99 mL/min (ref >60.00)
Potassium: 4.5 mEq/L (ref 3.5–5.1)
Sodium: 137 mEq/L (ref 135–145)

## 2010-11-15 ENCOUNTER — Ambulatory Visit: Payer: Self-pay | Admitting: Internal Medicine

## 2010-11-15 ENCOUNTER — Ambulatory Visit (INDEPENDENT_AMBULATORY_CARE_PROVIDER_SITE_OTHER): Payer: Managed Care, Other (non HMO) | Admitting: Licensed Clinical Social Worker

## 2010-11-15 ENCOUNTER — Encounter: Payer: Self-pay | Admitting: Internal Medicine

## 2010-11-15 DIAGNOSIS — F4323 Adjustment disorder with mixed anxiety and depressed mood: Secondary | ICD-10-CM

## 2010-11-16 ENCOUNTER — Encounter: Payer: Self-pay | Admitting: Internal Medicine

## 2010-11-16 ENCOUNTER — Ambulatory Visit (INDEPENDENT_AMBULATORY_CARE_PROVIDER_SITE_OTHER): Payer: Managed Care, Other (non HMO) | Admitting: Internal Medicine

## 2010-11-16 ENCOUNTER — Other Ambulatory Visit: Payer: Self-pay | Admitting: Internal Medicine

## 2010-11-16 DIAGNOSIS — R7309 Other abnormal glucose: Secondary | ICD-10-CM

## 2010-11-16 DIAGNOSIS — E785 Hyperlipidemia, unspecified: Secondary | ICD-10-CM

## 2010-11-16 DIAGNOSIS — F1020 Alcohol dependence, uncomplicated: Secondary | ICD-10-CM

## 2010-11-16 DIAGNOSIS — I10 Essential (primary) hypertension: Secondary | ICD-10-CM

## 2010-11-16 MED ORDER — TRIAMCINOLONE ACETONIDE 0.1 % EX CREA: TOPICAL_CREAM | Freq: Two times a day (BID) | CUTANEOUS | Status: DC

## 2010-11-16 NOTE — Assessment & Plan Note (Signed)
Wt loss discussed Wt Readings from Last 3 Encounters:  11/16/10 242 lb (109.77 kg)  05/18/10 235 lb (106.595 kg)  05/25/10 234 lb (106.142 kg)

## 2010-11-16 NOTE — Assessment & Plan Note (Signed)
Not drinking 

## 2010-11-16 NOTE — Progress Notes (Signed)
  Subjective:    Patient ID: Roberto Swanson, male    DOB: 1955-09-02, 55 y.o.   MRN: [id number]  HPI The patient presents for a follow-up of  chronic hypertension, chronic dyslipidemia, type 2 diabetes controlled with medicines     Review of Systems  Constitutional: Positive for unexpected weight change (gained). Negative for appetite change and fatigue.  HENT: Negative for nosebleeds, congestion, sore throat, sneezing, trouble swallowing and neck pain.   Eyes: Negative for itching and visual disturbance.  Respiratory: Negative for cough.   Cardiovascular: Negative for chest pain, palpitations and leg swelling.  Gastrointestinal: Negative for nausea, diarrhea, blood in stool and abdominal distention.  Genitourinary: Negative for frequency and hematuria.  Musculoskeletal: Negative for back pain, joint swelling and gait problem.  Skin: Negative for rash.  Neurological: Negative for dizziness, tremors, speech difficulty and weakness.  Psychiatric/Behavioral: Negative for suicidal ideas, sleep disturbance, dysphoric mood and agitation. The patient is not nervous/anxious.        Objective:   Physical Exam  Constitutional: He is oriented to person, place, and time. He appears well-developed.       Obese   HENT:  Mouth/Throat: Oropharynx is clear and moist.  Eyes: Conjunctivae are normal. Pupils are equal, round, and reactive to light.  Neck: Normal range of motion. No JVD present. No thyromegaly present.  Cardiovascular: Normal rate, regular rhythm, normal heart sounds and intact distal pulses.  Exam reveals no gallop and no friction rub.   No murmur heard. Pulmonary/Chest: Effort normal and breath sounds normal. No respiratory distress. He has no wheezes. He has no rales. He exhibits no tenderness.  Abdominal: Soft. Bowel sounds are normal. He exhibits no distension and no mass. There is no tenderness. There is no rebound and no guarding.  Musculoskeletal: Normal range of motion. He  exhibits no edema and no tenderness.  Lymphadenopathy:    He has no cervical adenopathy.  Neurological: He is alert and oriented to person, place, and time. He has normal reflexes. No cranial nerve deficit. He exhibits normal muscle tone. Coordination normal.  Skin: Skin is warm and dry. No rash noted.  Psychiatric: He has a normal mood and affect. His behavior is normal. Judgment and thought content normal.        Lab Results  Component Value Date   WBC 10.6* 06/16/2010   HGB 9.3* 06/16/2010   HCT 28.2* 06/16/2010   PLT 166 06/16/2010   CHOL 172 11/11/2010   TRIG 233.0* 11/11/2010   HDL 40.70 11/11/2010   LDLDIRECT 74.7 11/11/2010   ALT 44 11/11/2010   AST 24 11/11/2010   NA 137 11/11/2010   K 4.5 11/11/2010   CL 103 11/11/2010   CREATININE 0.7 11/11/2010   BUN 19 11/11/2010   CO2 26 11/11/2010   TSH 1.55 05/12/2010   PSA 1.03 05/12/2010   INR 1.09 06/16/2010   HGBA1C 6.1 04/14/2009     Assessment & Plan:

## 2010-12-05 ENCOUNTER — Ambulatory Visit (INDEPENDENT_AMBULATORY_CARE_PROVIDER_SITE_OTHER): Payer: Managed Care, Other (non HMO) | Admitting: Licensed Clinical Social Worker

## 2010-12-05 DIAGNOSIS — F4323 Adjustment disorder with mixed anxiety and depressed mood: Secondary | ICD-10-CM

## 2010-12-14 ENCOUNTER — Ambulatory Visit (INDEPENDENT_AMBULATORY_CARE_PROVIDER_SITE_OTHER): Payer: Managed Care, Other (non HMO) | Admitting: Licensed Clinical Social Worker

## 2010-12-14 DIAGNOSIS — F4323 Adjustment disorder with mixed anxiety and depressed mood: Secondary | ICD-10-CM

## 2010-12-20 ENCOUNTER — Other Ambulatory Visit: Payer: Self-pay | Admitting: Internal Medicine

## 2010-12-23 ENCOUNTER — Ambulatory Visit (INDEPENDENT_AMBULATORY_CARE_PROVIDER_SITE_OTHER): Payer: Managed Care, Other (non HMO) | Admitting: Licensed Clinical Social Worker

## 2010-12-23 DIAGNOSIS — F4323 Adjustment disorder with mixed anxiety and depressed mood: Secondary | ICD-10-CM

## 2011-01-05 ENCOUNTER — Ambulatory Visit (INDEPENDENT_AMBULATORY_CARE_PROVIDER_SITE_OTHER): Payer: Managed Care, Other (non HMO) | Admitting: Licensed Clinical Social Worker

## 2011-01-05 DIAGNOSIS — F4323 Adjustment disorder with mixed anxiety and depressed mood: Secondary | ICD-10-CM

## 2011-01-13 ENCOUNTER — Ambulatory Visit (INDEPENDENT_AMBULATORY_CARE_PROVIDER_SITE_OTHER): Payer: Managed Care, Other (non HMO) | Admitting: Licensed Clinical Social Worker

## 2011-01-13 DIAGNOSIS — F4323 Adjustment disorder with mixed anxiety and depressed mood: Secondary | ICD-10-CM

## 2011-03-21 ENCOUNTER — Ambulatory Visit (INDEPENDENT_AMBULATORY_CARE_PROVIDER_SITE_OTHER): Payer: Managed Care, Other (non HMO) | Admitting: Licensed Clinical Social Worker

## 2011-03-21 DIAGNOSIS — F4323 Adjustment disorder with mixed anxiety and depressed mood: Secondary | ICD-10-CM

## 2011-03-31 ENCOUNTER — Ambulatory Visit (INDEPENDENT_AMBULATORY_CARE_PROVIDER_SITE_OTHER): Payer: Managed Care, Other (non HMO) | Admitting: Licensed Clinical Social Worker

## 2011-03-31 DIAGNOSIS — F4323 Adjustment disorder with mixed anxiety and depressed mood: Secondary | ICD-10-CM

## 2011-04-21 ENCOUNTER — Other Ambulatory Visit: Payer: Self-pay | Admitting: Internal Medicine

## 2011-04-26 ENCOUNTER — Ambulatory Visit: Payer: Managed Care, Other (non HMO)

## 2011-04-26 DIAGNOSIS — Z0389 Encounter for observation for other suspected diseases and conditions ruled out: Secondary | ICD-10-CM

## 2011-04-26 DIAGNOSIS — Z0000 Encounter for general adult medical examination without abnormal findings: Secondary | ICD-10-CM

## 2011-04-26 LAB — CBC WITH DIFFERENTIAL/PLATELET
Basophils Absolute: 0.1 10*3/uL (ref 0.0–0.1)
Eosinophils Absolute: 0.1 10*3/uL (ref 0.0–0.7)
Lymphocytes Relative: 23 % (ref 12.0–46.0)
MCHC: 34.4 g/dL (ref 30.0–36.0)
MCV: 88.9 fl (ref 78.0–100.0)
Monocytes Absolute: 0.6 10*3/uL (ref 0.1–1.0)
Neutrophils Relative %: 65 % (ref 43.0–77.0)
Platelets: 286 10*3/uL (ref 150.0–400.0)
RDW: 13.5 % (ref 11.5–14.6)

## 2011-04-26 LAB — HEPATIC FUNCTION PANEL
ALT: 52 U/L (ref 0–53)
Albumin: 4.5 g/dL (ref 3.5–5.2)
Alkaline Phosphatase: 82 U/L (ref 39–117)
Bilirubin, Direct: 0.1 mg/dL (ref 0.0–0.3)
Total Protein: 7.5 g/dL (ref 6.0–8.3)

## 2011-04-26 LAB — BASIC METABOLIC PANEL
Chloride: 102 mEq/L (ref 96–112)
GFR: 97.59 mL/min (ref >60.00)
Potassium: 4.3 mEq/L (ref 3.5–5.1)

## 2011-04-26 LAB — LIPID PANEL
Cholesterol: 165 mg/dL (ref 0–200)
Total CHOL/HDL Ratio: 4
Triglycerides: 229 mg/dL — ABNORMAL HIGH (ref 0.0–149.0)
VLDL: 45.8 mg/dL — ABNORMAL HIGH (ref 0.0–40.0)

## 2011-04-26 LAB — URINALYSIS
Bilirubin Urine: NEGATIVE
Ketones, ur: NEGATIVE
Leukocytes, UA: NEGATIVE
Specific Gravity, Urine: 1.02 (ref 1.000–1.030)
Urine Glucose: NEGATIVE
Urobilinogen, UA: 0.2 (ref 0.0–1.0)
pH: 6.5 (ref 5.0–8.0)

## 2011-04-26 LAB — PSA: PSA: 0.73 ng/mL (ref 0.10–4.00)

## 2011-05-03 ENCOUNTER — Encounter: Payer: Self-pay | Admitting: Internal Medicine

## 2011-05-03 ENCOUNTER — Ambulatory Visit (INDEPENDENT_AMBULATORY_CARE_PROVIDER_SITE_OTHER): Payer: Managed Care, Other (non HMO) | Admitting: Internal Medicine

## 2011-05-03 VITALS — BP 140/90 | HR 80 | Temp 99.1°F | Resp 16 | Wt 247.0 lb

## 2011-05-03 DIAGNOSIS — Z23 Encounter for immunization: Secondary | ICD-10-CM

## 2011-05-03 DIAGNOSIS — R7309 Other abnormal glucose: Secondary | ICD-10-CM

## 2011-05-03 DIAGNOSIS — Z0000 Encounter for general adult medical examination without abnormal findings: Secondary | ICD-10-CM | POA: Insufficient documentation

## 2011-05-03 DIAGNOSIS — M25539 Pain in unspecified wrist: Secondary | ICD-10-CM

## 2011-05-03 DIAGNOSIS — E785 Hyperlipidemia, unspecified: Secondary | ICD-10-CM

## 2011-05-03 DIAGNOSIS — M25531 Pain in right wrist: Secondary | ICD-10-CM | POA: Insufficient documentation

## 2011-05-03 DIAGNOSIS — R011 Cardiac murmur, unspecified: Secondary | ICD-10-CM | POA: Insufficient documentation

## 2011-05-03 DIAGNOSIS — R739 Hyperglycemia, unspecified: Secondary | ICD-10-CM

## 2011-05-03 NOTE — Assessment & Plan Note (Addendum)
We discussed age appropriate health related issues, including available/recomended screening tests and vaccinations. We discussed a need for adhering to healthy diet and exercise. Labs/EKG were reviewed/ordered. All questions were answered. Loose wt.  Wt Readings from Last 3 Encounters:  05/03/11 247 lb (112.038 kg)  11/16/10 242 lb (109.77 kg)  05/18/10 235 lb (106.595 kg)

## 2011-05-03 NOTE — Assessment & Plan Note (Signed)
Continue with current prescription therapy as reflected on the Med list.  

## 2011-05-03 NOTE — Assessment & Plan Note (Signed)
Strain NSAID prn

## 2011-05-03 NOTE — Progress Notes (Signed)
  Subjective:    Patient ID: Roberto Swanson, male    DOB: 11-Apr-1956, 55 y.o.   MRN: [id number]  HPI  The patient is here for a wellness exam. The patient has been doing well overall without major physical or psychological issues going on lately. The patient needs to address  chronic hypertension that has been well controlled with medicines; to address chronic  hyperlipidemia controlled with medicines as well; and to address type 2 chronic pre-diabetes, controlled with medical treatment and diet.   Review of Systems  Constitutional: Negative for appetite change, fatigue and unexpected weight change.  HENT: Negative for nosebleeds, congestion, sore throat, sneezing, trouble swallowing and neck pain.   Eyes: Negative for itching and visual disturbance.  Respiratory: Negative for cough.   Cardiovascular: Negative for chest pain, palpitations and leg swelling.  Gastrointestinal: Negative for nausea, diarrhea, blood in stool and abdominal distention.  Genitourinary: Negative for frequency and hematuria.  Musculoskeletal: Negative for back pain, joint swelling and gait problem.  Skin: Negative for rash.  Neurological: Negative for dizziness, tremors, speech difficulty and weakness.  Psychiatric/Behavioral: Negative for sleep disturbance, dysphoric mood and agitation. The patient is not nervous/anxious.        Objective:   Physical Exam  Constitutional: He is oriented to person, place, and time. He appears well-developed.  HENT:  Mouth/Throat: Oropharynx is clear and moist.  Eyes: Conjunctivae are normal. Pupils are equal, round, and reactive to light.  Neck: Normal range of motion. No JVD present. No thyromegaly present.  Cardiovascular: Normal rate, regular rhythm and intact distal pulses.  Exam reveals no gallop and no friction rub.   Murmur (2/6) heard. Pulmonary/Chest: Effort normal and breath sounds normal. No respiratory distress. He has no wheezes. He has no rales. He exhibits no  tenderness.  Abdominal: Soft. Bowel sounds are normal. He exhibits no distension and no mass. There is no tenderness. There is no rebound and no guarding.  Musculoskeletal: Normal range of motion. He exhibits tenderness (R wrist is tender). He exhibits no edema.  Lymphadenopathy:    He has no cervical adenopathy.  Neurological: He is alert and oriented to person, place, and time. He has normal reflexes. No cranial nerve deficit. He exhibits normal muscle tone. Coordination normal.  Skin: Skin is warm and dry. No rash noted.  Psychiatric: He has a normal mood and affect. His behavior is normal. Judgment and thought content normal.      Lab Results  Component Value Date   WBC 6.8 04/26/2011   HGB 14.3 04/26/2011   HCT 41.5 04/26/2011   PLT 286.0 04/26/2011   GLUCOSE 140* 04/26/2011   CHOL 165 04/26/2011   TRIG 229.0* 04/26/2011   HDL 42.60 04/26/2011   LDLDIRECT 76.8 04/26/2011   LDLCALC 94 07/08/2009   ALT 52 04/26/2011   AST 33 04/26/2011   NA 136 04/26/2011   K 4.3 04/26/2011   CL 102 04/26/2011   CREATININE 0.9 04/26/2011   BUN 19 04/26/2011   CO2 24 04/26/2011   TSH 1.20 04/26/2011   PSA 0.73 04/26/2011   INR 1.09 06/16/2010   HGBA1C 6.1 04/14/2009      Assessment & Plan:

## 2011-05-10 ENCOUNTER — Ambulatory Visit (HOSPITAL_COMMUNITY): Payer: Managed Care, Other (non HMO) | Attending: Cardiology | Admitting: Radiology

## 2011-05-10 ENCOUNTER — Ambulatory Visit (INDEPENDENT_AMBULATORY_CARE_PROVIDER_SITE_OTHER): Payer: Managed Care, Other (non HMO) | Admitting: Licensed Clinical Social Worker

## 2011-05-10 DIAGNOSIS — F4323 Adjustment disorder with mixed anxiety and depressed mood: Secondary | ICD-10-CM

## 2011-05-10 DIAGNOSIS — E785 Hyperlipidemia, unspecified: Secondary | ICD-10-CM | POA: Insufficient documentation

## 2011-05-10 DIAGNOSIS — I10 Essential (primary) hypertension: Secondary | ICD-10-CM | POA: Insufficient documentation

## 2011-05-10 DIAGNOSIS — R011 Cardiac murmur, unspecified: Secondary | ICD-10-CM

## 2011-05-10 DIAGNOSIS — I079 Rheumatic tricuspid valve disease, unspecified: Secondary | ICD-10-CM | POA: Insufficient documentation

## 2011-05-11 ENCOUNTER — Telehealth: Payer: Self-pay | Admitting: Internal Medicine

## 2011-05-11 NOTE — Telephone Encounter (Signed)
Result mailed to pt. Left message on machine for pt to return my call.

## 2011-05-11 NOTE — Telephone Encounter (Signed)
Pt informed

## 2011-05-11 NOTE — Telephone Encounter (Signed)
Roberto Swanson, please, inform patient that the ECHO was OK. Cont meds - needs to have a good BP control.  Please, mail the ECHO to the patient. Thx

## 2011-06-18 ENCOUNTER — Other Ambulatory Visit: Payer: Self-pay | Admitting: Internal Medicine

## 2011-08-07 ENCOUNTER — Ambulatory Visit (INDEPENDENT_AMBULATORY_CARE_PROVIDER_SITE_OTHER): Payer: Managed Care, Other (non HMO) | Admitting: Licensed Clinical Social Worker

## 2011-08-07 DIAGNOSIS — F4323 Adjustment disorder with mixed anxiety and depressed mood: Secondary | ICD-10-CM

## 2011-09-04 ENCOUNTER — Ambulatory Visit: Payer: Managed Care, Other (non HMO) | Admitting: Internal Medicine

## 2011-10-31 ENCOUNTER — Ambulatory Visit: Payer: Managed Care, Other (non HMO) | Admitting: Internal Medicine

## 2011-11-21 ENCOUNTER — Other Ambulatory Visit: Payer: Self-pay | Admitting: Internal Medicine

## 2011-11-28 ENCOUNTER — Other Ambulatory Visit (INDEPENDENT_AMBULATORY_CARE_PROVIDER_SITE_OTHER): Payer: Managed Care, Other (non HMO)

## 2011-11-28 DIAGNOSIS — R739 Hyperglycemia, unspecified: Secondary | ICD-10-CM

## 2011-11-28 DIAGNOSIS — R7309 Other abnormal glucose: Secondary | ICD-10-CM

## 2011-11-28 LAB — BASIC METABOLIC PANEL
BUN: 18 mg/dL (ref 6–23)
CO2: 27 mEq/L (ref 19–32)
Calcium: 9.7 mg/dL (ref 8.4–10.5)
Chloride: 100 mEq/L (ref 96–112)
Creatinine, Ser: 1 mg/dL (ref 0.4–1.5)

## 2011-11-28 LAB — HEMOGLOBIN A1C: Hgb A1c MFr Bld: 7.2 % — ABNORMAL HIGH (ref 4.6–6.5)

## 2011-12-04 ENCOUNTER — Ambulatory Visit (INDEPENDENT_AMBULATORY_CARE_PROVIDER_SITE_OTHER): Payer: Managed Care, Other (non HMO) | Admitting: Internal Medicine

## 2011-12-04 ENCOUNTER — Encounter: Payer: Self-pay | Admitting: Internal Medicine

## 2011-12-04 VITALS — BP 148/90 | HR 80 | Temp 98.0°F | Resp 16 | Wt 249.0 lb

## 2011-12-04 DIAGNOSIS — F329 Major depressive disorder, single episode, unspecified: Secondary | ICD-10-CM

## 2011-12-04 DIAGNOSIS — R7309 Other abnormal glucose: Secondary | ICD-10-CM

## 2011-12-04 DIAGNOSIS — R739 Hyperglycemia, unspecified: Secondary | ICD-10-CM

## 2011-12-04 DIAGNOSIS — E119 Type 2 diabetes mellitus without complications: Secondary | ICD-10-CM

## 2011-12-04 DIAGNOSIS — E785 Hyperlipidemia, unspecified: Secondary | ICD-10-CM

## 2011-12-04 DIAGNOSIS — F3289 Other specified depressive episodes: Secondary | ICD-10-CM

## 2011-12-04 DIAGNOSIS — I10 Essential (primary) hypertension: Secondary | ICD-10-CM

## 2011-12-04 MED ORDER — METFORMIN HCL 500 MG PO TABS: 500.0000 mg | ORAL_TABLET | Freq: Two times a day (BID) | ORAL | Status: DC

## 2011-12-04 MED ORDER — ESCITALOPRAM OXALATE 10 MG PO TABS: 20.0000 mg | ORAL_TABLET | Freq: Every day | ORAL | Status: DC

## 2011-12-04 NOTE — Assessment & Plan Note (Signed)
Continue with current prescription therapy as reflected on the Med list.  

## 2011-12-04 NOTE — Progress Notes (Signed)
Patient ID: Roberto Swanson, male   DOB: 12/06/1955, 56 y.o.   MRN: [id number]  Subjective:    Patient ID: Roberto Swanson, male    DOB: 05/18/1956, 56 y.o.   MRN: [id number]  HPI  The patient has been doing well overall without major physical or psychological issues going on lately. The patient needs to address  chronic hypertension that has been well controlled with medicines; to address chronic  hyperlipidemia controlled with medicines as well; and to address  pre-diabetes, controlled with medical treatment and diet.  BP Readings from Last 3 Encounters:  12/04/11 148/90  05/03/11 140/90  11/16/10 150/98   Wt Readings from Last 3 Encounters:  12/04/11 249 lb (112.946 kg)  05/03/11 247 lb (112.038 kg)  11/16/10 242 lb (109.77 kg)       Review of Systems  Constitutional: Negative for appetite change, fatigue and unexpected weight change.  HENT: Negative for nosebleeds, congestion, sore throat, sneezing, trouble swallowing and neck pain.   Eyes: Negative for itching and visual disturbance.  Respiratory: Negative for cough.   Cardiovascular: Negative for chest pain, palpitations and leg swelling.  Gastrointestinal: Negative for nausea, diarrhea, blood in stool and abdominal distention.  Genitourinary: Negative for frequency and hematuria.  Musculoskeletal: Negative for back pain, joint swelling and gait problem.  Skin: Negative for rash.  Neurological: Negative for dizziness, tremors, speech difficulty and weakness.  Psychiatric/Behavioral: Negative for disturbed wake/sleep cycle, dysphoric mood and agitation. The patient is not nervous/anxious.        Objective:   Physical Exam  Constitutional: He is oriented to person, place, and time. He appears well-developed.       Obese  HENT:  Mouth/Throat: Oropharynx is clear and moist.  Eyes: Conjunctivae are normal. Pupils are equal, round, and reactive to light.  Neck: Normal range of motion. No JVD present. No thyromegaly  present.  Cardiovascular: Normal rate, regular rhythm and intact distal pulses.  Exam reveals no gallop and no friction rub.   Murmur (2/6) heard. Pulmonary/Chest: Effort normal and breath sounds normal. No respiratory distress. He has no wheezes. He has no rales. He exhibits no tenderness.  Abdominal: Soft. Bowel sounds are normal. He exhibits no distension and no mass. There is no tenderness. There is no rebound and no guarding.  Musculoskeletal: Normal range of motion. He exhibits no edema and no tenderness.  Lymphadenopathy:    He has no cervical adenopathy.  Neurological: He is alert and oriented to person, place, and time. He has normal reflexes. No cranial nerve deficit. He exhibits normal muscle tone. Coordination normal.  Skin: Skin is warm and dry. No rash noted.  Psychiatric: He has a normal mood and affect. His behavior is normal. Judgment and thought content normal.      Lab Results  Component Value Date   WBC 6.8 04/26/2011   HGB 14.3 04/26/2011   HCT 41.5 04/26/2011   PLT 286.0 04/26/2011   GLUCOSE 155* 11/28/2011   CHOL 165 04/26/2011   TRIG 229.0* 04/26/2011   HDL 42.60 04/26/2011   LDLDIRECT 76.8 04/26/2011   LDLCALC 94 07/08/2009   ALT 52 04/26/2011   AST 33 04/26/2011   NA 137 11/28/2011   K 4.6 11/28/2011   CL 100 11/28/2011   CREATININE 1.0 11/28/2011   BUN 18 11/28/2011   CO2 27 11/28/2011   TSH 1.20 04/26/2011   PSA 0.73 04/26/2011   INR 1.09 06/16/2010   HGBA1C 7.2* 11/28/2011      Assessment & Plan:

## 2011-12-04 NOTE — Assessment & Plan Note (Signed)
Suboptimal control States BP is nl at home

## 2011-12-04 NOTE — Assessment & Plan Note (Signed)
Doing well Continue with current prescription therapy as reflected on the Med list.  

## 2011-12-05 DIAGNOSIS — E119 Type 2 diabetes mellitus without complications: Secondary | ICD-10-CM | POA: Insufficient documentation

## 2011-12-05 NOTE — Assessment & Plan Note (Signed)
Discussed Start Metformin Info provided

## 2012-01-09 ENCOUNTER — Other Ambulatory Visit: Payer: Self-pay | Admitting: Internal Medicine

## 2012-03-08 ENCOUNTER — Other Ambulatory Visit (INDEPENDENT_AMBULATORY_CARE_PROVIDER_SITE_OTHER): Payer: Managed Care, Other (non HMO)

## 2012-03-08 DIAGNOSIS — I10 Essential (primary) hypertension: Secondary | ICD-10-CM

## 2012-03-08 DIAGNOSIS — R739 Hyperglycemia, unspecified: Secondary | ICD-10-CM

## 2012-03-08 DIAGNOSIS — R7309 Other abnormal glucose: Secondary | ICD-10-CM

## 2012-03-08 DIAGNOSIS — E785 Hyperlipidemia, unspecified: Secondary | ICD-10-CM

## 2012-03-08 LAB — BASIC METABOLIC PANEL
Calcium: 9.6 mg/dL (ref 8.4–10.5)
Creatinine, Ser: 0.8 mg/dL (ref 0.4–1.5)
GFR: 108.89 mL/min (ref >60.00)
Glucose, Bld: 128 mg/dL — ABNORMAL HIGH (ref 70–99)
Sodium: 138 mEq/L (ref 135–145)

## 2012-03-08 LAB — HEMOGLOBIN A1C: Hgb A1c MFr Bld: 6.7 % — ABNORMAL HIGH (ref 4.6–6.5)

## 2012-03-13 ENCOUNTER — Other Ambulatory Visit: Payer: Self-pay | Admitting: Internal Medicine

## 2012-03-13 ENCOUNTER — Other Ambulatory Visit: Payer: Self-pay | Admitting: *Deleted

## 2012-03-13 MED ORDER — TRIAMCINOLONE ACETONIDE 0.1 % EX CREA: TOPICAL_CREAM | Freq: Two times a day (BID) | CUTANEOUS | Status: DC

## 2012-03-14 ENCOUNTER — Ambulatory Visit: Payer: Managed Care, Other (non HMO) | Admitting: Internal Medicine

## 2012-03-29 ENCOUNTER — Encounter: Payer: Self-pay | Admitting: Internal Medicine

## 2012-03-29 ENCOUNTER — Ambulatory Visit (INDEPENDENT_AMBULATORY_CARE_PROVIDER_SITE_OTHER): Payer: Managed Care, Other (non HMO) | Admitting: Internal Medicine

## 2012-03-29 VITALS — BP 130/82 | HR 72 | Temp 98.2°F | Resp 16 | Wt 236.0 lb

## 2012-03-29 DIAGNOSIS — F329 Major depressive disorder, single episode, unspecified: Secondary | ICD-10-CM

## 2012-03-29 DIAGNOSIS — I10 Essential (primary) hypertension: Secondary | ICD-10-CM

## 2012-03-29 DIAGNOSIS — E119 Type 2 diabetes mellitus without complications: Secondary | ICD-10-CM

## 2012-03-29 DIAGNOSIS — E785 Hyperlipidemia, unspecified: Secondary | ICD-10-CM

## 2012-03-29 DIAGNOSIS — Z23 Encounter for immunization: Secondary | ICD-10-CM

## 2012-03-29 NOTE — Assessment & Plan Note (Signed)
Better  

## 2012-03-29 NOTE — Progress Notes (Signed)
   Subjective:    Patient ID: Roberto Swanson, male    DOB: [birth year], 56 y.o.   MRN: [id number]  HPI  The patient has been doing well overall without major physical or psychological issues going on lately. The patient needs to address  chronic hypertension that has been well controlled with medicines; to address chronic  hyperlipidemia controlled with medicines as well; and to address  pre-diabetes, controlled with medical treatment and diet.  BP Readings from Last 3 Encounters:  03/29/12 130/82  12/04/11 148/90  05/03/11 140/90   Wt Readings from Last 3 Encounters:  03/29/12 236 lb (107.049 kg)  12/04/11 249 lb (112.946 kg)  05/03/11 247 lb (112.038 kg)       Review of Systems  Constitutional: Negative for appetite change, fatigue and unexpected weight change.  HENT: Negative for nosebleeds, congestion, sore throat, sneezing, trouble swallowing and neck pain.   Eyes: Negative for itching and visual disturbance.  Respiratory: Negative for cough.   Cardiovascular: Negative for chest pain, palpitations and leg swelling.  Gastrointestinal: Negative for nausea, diarrhea, blood in stool and abdominal distention.  Genitourinary: Negative for frequency and hematuria.  Musculoskeletal: Negative for back pain, joint swelling and gait problem.  Skin: Negative for rash.  Neurological: Negative for dizziness, tremors, speech difficulty and weakness.  Psychiatric/Behavioral: Negative for sleep disturbance, dysphoric mood and agitation. The patient is not nervous/anxious.        Objective:   Physical Exam  Constitutional: He is oriented to person, place, and time. He appears well-developed.       Obese  HENT:  Mouth/Throat: Oropharynx is clear and moist.  Eyes: Conjunctivae normal are normal. Pupils are equal, round, and reactive to light.  Neck: Normal range of motion. No JVD present. No thyromegaly present.  Cardiovascular: Normal rate, regular rhythm and intact distal pulses.   Exam reveals no gallop and no friction rub.   Murmur (2/6) heard. Pulmonary/Chest: Effort normal and breath sounds normal. No respiratory distress. He has no wheezes. He has no rales. He exhibits no tenderness.  Abdominal: Soft. Bowel sounds are normal. He exhibits no distension and no mass. There is no tenderness. There is no rebound and no guarding.  Musculoskeletal: Normal range of motion. He exhibits no edema and no tenderness.  Lymphadenopathy:    He has no cervical adenopathy.  Neurological: He is alert and oriented to person, place, and time. He has normal reflexes. No cranial nerve deficit. He exhibits normal muscle tone. Coordination normal.  Skin: Skin is warm and dry. No rash noted.  Psychiatric: He has a normal mood and affect. His behavior is normal. Judgment and thought content normal.      Lab Results  Component Value Date   WBC 6.8 04/26/2011   HGB 14.3 04/26/2011   HCT 41.5 04/26/2011   PLT 286.0 04/26/2011   GLUCOSE 128* 03/08/2012   CHOL 165 04/26/2011   TRIG 229.0* 04/26/2011   HDL 42.60 04/26/2011   LDLDIRECT 76.8 04/26/2011   LDLCALC 94 07/08/2009   ALT 52 04/26/2011   AST 33 04/26/2011   NA 138 03/08/2012   K 4.9 03/08/2012   CL 102 03/08/2012   CREATININE 0.8 03/08/2012   BUN 17 03/08/2012   CO2 28 03/08/2012   TSH 1.20 04/26/2011   PSA 0.73 04/26/2011   INR 1.09 06/16/2010   HGBA1C 6.7* 03/08/2012      Assessment & Plan:

## 2012-03-29 NOTE — Assessment & Plan Note (Signed)
Continue with current prescription therapy as reflected on the Med list.  

## 2012-03-29 NOTE — Assessment & Plan Note (Signed)
Better Continue with current prescription therapy as reflected on the Med list.  

## 2012-04-10 ENCOUNTER — Other Ambulatory Visit: Payer: Self-pay | Admitting: Internal Medicine

## 2012-04-10 DIAGNOSIS — Z0000 Encounter for general adult medical examination without abnormal findings: Secondary | ICD-10-CM

## 2012-04-10 DIAGNOSIS — Z0389 Encounter for observation for other suspected diseases and conditions ruled out: Secondary | ICD-10-CM

## 2012-04-25 ENCOUNTER — Ambulatory Visit (INDEPENDENT_AMBULATORY_CARE_PROVIDER_SITE_OTHER): Payer: Managed Care, Other (non HMO) | Admitting: Licensed Clinical Social Worker

## 2012-04-25 DIAGNOSIS — F4323 Adjustment disorder with mixed anxiety and depressed mood: Secondary | ICD-10-CM

## 2012-06-12 ENCOUNTER — Other Ambulatory Visit: Payer: Self-pay | Admitting: Internal Medicine

## 2012-09-27 ENCOUNTER — Encounter: Payer: Managed Care, Other (non HMO) | Admitting: Internal Medicine

## 2012-12-06 ENCOUNTER — Other Ambulatory Visit: Payer: Self-pay | Admitting: Internal Medicine

## 2013-03-27 ENCOUNTER — Other Ambulatory Visit: Payer: Self-pay

## 2013-05-09 ENCOUNTER — Encounter: Payer: Self-pay | Admitting: Cardiology

## 2013-11-07 ENCOUNTER — Encounter: Payer: Self-pay | Admitting: Internal Medicine

## 2013-11-07 ENCOUNTER — Ambulatory Visit (INDEPENDENT_AMBULATORY_CARE_PROVIDER_SITE_OTHER): Payer: Managed Care, Other (non HMO) | Admitting: Internal Medicine

## 2013-11-07 VITALS — BP 160/100 | HR 80 | Temp 98.3°F | Resp 16 | Ht 70.5 in | Wt 235.0 lb

## 2013-11-07 DIAGNOSIS — E785 Hyperlipidemia, unspecified: Secondary | ICD-10-CM

## 2013-11-07 DIAGNOSIS — I10 Essential (primary) hypertension: Secondary | ICD-10-CM

## 2013-11-07 DIAGNOSIS — E119 Type 2 diabetes mellitus without complications: Secondary | ICD-10-CM

## 2013-11-07 DIAGNOSIS — F3289 Other specified depressive episodes: Secondary | ICD-10-CM

## 2013-11-07 DIAGNOSIS — F329 Major depressive disorder, single episode, unspecified: Secondary | ICD-10-CM

## 2013-11-07 MED ORDER — METFORMIN HCL ER (MOD) 1000 MG PO TB24: 1000.0000 mg | ORAL_TABLET | Freq: Two times a day (BID) | ORAL | Status: DC

## 2013-11-07 MED ORDER — TRIAMCINOLONE ACETONIDE 0.1 % EX CREA: TOPICAL_CREAM | Freq: Two times a day (BID) | CUTANEOUS | Status: DC

## 2013-11-07 MED ORDER — ATORVASTATIN CALCIUM 40 MG PO TABS: 40.0000 mg | ORAL_TABLET | Freq: Every day | ORAL | Status: DC

## 2013-11-07 MED ORDER — BUPROPION HCL ER (SR) 150 MG PO TB12: 150.0000 mg | ORAL_TABLET | Freq: Two times a day (BID) | ORAL | Status: DC

## 2013-11-07 MED ORDER — ESCITALOPRAM OXALATE 20 MG PO TABS: 20.0000 mg | ORAL_TABLET | Freq: Every day | ORAL | Status: DC

## 2013-11-07 NOTE — Assessment & Plan Note (Signed)
Continue with current prescription therapy as reflected on the Med list.  

## 2013-11-07 NOTE — Progress Notes (Signed)
   Subjective:   HPI  [place]  The patient needs to address  chronic hypertension that has been well controlled with medicines; to address chronic  hyperlipidemia controlled with medicines as well; and to address diabetes, controlled with medical treatment and diet.  BP Readings from Last 3 Encounters:  11/07/13 160/100  03/29/12 130/82  12/04/11 148/90   Wt Readings from Last 3 Encounters:  11/07/13 235 lb (106.595 kg)  03/29/12 236 lb (107.049 kg)  12/04/11 249 lb (112.946 kg)       Review of Systems  Constitutional: Negative for appetite change, fatigue and unexpected weight change.  HENT: Negative for congestion, nosebleeds, sneezing, sore throat and trouble swallowing.   Eyes: Negative for itching and visual disturbance.  Respiratory: Negative for cough.   Cardiovascular: Negative for chest pain, palpitations and leg swelling.  Gastrointestinal: Negative for nausea, diarrhea, blood in stool and abdominal distention.  Genitourinary: Negative for frequency and hematuria.  Musculoskeletal: Negative for back pain, gait problem, joint swelling and neck pain.  Skin: Negative for rash.  Neurological: Negative for dizziness, tremors, speech difficulty and weakness.  Psychiatric/Behavioral: Negative for sleep disturbance, dysphoric mood and agitation. The patient is not nervous/anxious.        Objective:   Physical Exam  Constitutional: He is oriented to person, place, and time. He appears well-developed.  Obese  HENT:  Mouth/Throat: Oropharynx is clear and moist.  Eyes: Conjunctivae are normal. Pupils are equal, round, and reactive to light.  Neck: Normal range of motion. No JVD present. No thyromegaly present.  Cardiovascular: Normal rate, regular rhythm and intact distal pulses.  Exam reveals no gallop and no friction rub.   Murmur (2/6) heard. Pulmonary/Chest: Effort normal and breath sounds normal. No respiratory distress. He has no  wheezes. He has no rales. He exhibits no tenderness.  Abdominal: Soft. Bowel sounds are normal. He exhibits no distension and no mass. There is no tenderness. There is no rebound and no guarding.  Musculoskeletal: Normal range of motion. He exhibits no edema and no tenderness.  Lymphadenopathy:    He has no cervical adenopathy.  Neurological: He is alert and oriented to person, place, and time. He has normal reflexes. No cranial nerve deficit. He exhibits normal muscle tone. Coordination normal.  Skin: Skin is warm and dry. No rash noted.  Psychiatric: He has a normal mood and affect. His behavior is normal. Judgment and thought content normal.      Lab Results  Component Value Date   WBC 6.8 04/26/2011   HGB 14.3 04/26/2011   HCT 41.5 04/26/2011   PLT 286.0 04/26/2011   GLUCOSE 128* 03/08/2012   CHOL 165 04/26/2011   TRIG 229.0* 04/26/2011   HDL 42.60 04/26/2011   LDLDIRECT 76.8 04/26/2011   LDLCALC 94 07/08/2009   ALT 52 04/26/2011   AST 33 04/26/2011   NA 138 03/08/2012   K 4.9 03/08/2012   CL 102 03/08/2012   CREATININE 0.8 03/08/2012   BUN 17 03/08/2012   CO2 28 03/08/2012   TSH 1.20 04/26/2011   PSA 0.73 04/26/2011   INR 1.09 06/16/2010   HGBA1C 6.7* 03/08/2012      Assessment & Plan:

## 2013-11-07 NOTE — Progress Notes (Signed)
Pre visit review using our clinic review tool, if applicable. No additional management support is needed unless otherwise documented below in the visit note. 

## 2013-11-07 NOTE — Assessment & Plan Note (Signed)
Continue with current prescription therapy as reflected on the Med list. BP is ok at home

## 2013-11-08 ENCOUNTER — Telehealth: Payer: Self-pay | Admitting: Internal Medicine

## 2013-11-08 NOTE — Telephone Encounter (Signed)
Relevant patient education assigned to patient using Emmi. ° °

## 2013-11-13 ENCOUNTER — Ambulatory Visit: Payer: Managed Care, Other (non HMO) | Admitting: Internal Medicine

## 2013-11-20 ENCOUNTER — Telehealth: Payer: Self-pay | Admitting: *Deleted

## 2013-11-20 NOTE — Telephone Encounter (Signed)
Left message on machine for patient to come by for labs. Lab orders already placed.

## 2013-12-09 ENCOUNTER — Other Ambulatory Visit (INDEPENDENT_AMBULATORY_CARE_PROVIDER_SITE_OTHER): Payer: Managed Care, Other (non HMO)

## 2013-12-09 DIAGNOSIS — I10 Essential (primary) hypertension: Secondary | ICD-10-CM

## 2013-12-09 DIAGNOSIS — F329 Major depressive disorder, single episode, unspecified: Secondary | ICD-10-CM

## 2013-12-09 DIAGNOSIS — F3289 Other specified depressive episodes: Secondary | ICD-10-CM

## 2013-12-09 DIAGNOSIS — E119 Type 2 diabetes mellitus without complications: Secondary | ICD-10-CM

## 2013-12-09 DIAGNOSIS — E785 Hyperlipidemia, unspecified: Secondary | ICD-10-CM

## 2013-12-09 LAB — HEPATIC FUNCTION PANEL
ALT: 49 U/L (ref 0–53)
AST: 29 U/L (ref 0–37)
Albumin: 4.6 g/dL (ref 3.5–5.2)
Alkaline Phosphatase: 77 U/L (ref 39–117)
BILIRUBIN DIRECT: 0.1 mg/dL (ref 0.0–0.3)
Total Bilirubin: 0.8 mg/dL (ref 0.2–1.2)
Total Protein: 7.6 g/dL (ref 6.0–8.3)

## 2013-12-09 LAB — BASIC METABOLIC PANEL
BUN: 15 mg/dL (ref 6–23)
CO2: 25 meq/L (ref 19–32)
Calcium: 9.5 mg/dL (ref 8.4–10.5)
Chloride: 100 mEq/L (ref 96–112)
Creatinine, Ser: 0.8 mg/dL (ref 0.4–1.5)
GFR: 99.35 mL/min (ref >60.00)
Glucose, Bld: 102 mg/dL — ABNORMAL HIGH (ref 70–99)
POTASSIUM: 4.4 meq/L (ref 3.5–5.1)
SODIUM: 137 meq/L (ref 135–145)

## 2013-12-09 LAB — LIPID PANEL
Cholesterol: 154 mg/dL (ref 0–200)
HDL: 46.6 mg/dL (ref >39.00)
LDL Cholesterol: 58 mg/dL (ref 0–99)
NONHDL: 107.4
Total CHOL/HDL Ratio: 3
Triglycerides: 245 mg/dL — ABNORMAL HIGH (ref 0.0–149.0)
VLDL: 49 mg/dL — AB (ref 0.0–40.0)

## 2013-12-09 LAB — HEMOGLOBIN A1C: Hgb A1c MFr Bld: 6.9 % — ABNORMAL HIGH (ref 4.6–6.5)

## 2013-12-29 ENCOUNTER — Other Ambulatory Visit: Payer: Self-pay | Admitting: *Deleted

## 2013-12-29 NOTE — Telephone Encounter (Signed)
What milligram on the metformin xr...Raechel Chute/lmb

## 2013-12-29 NOTE — Telephone Encounter (Signed)
Left msg on triage stating would like for md to change his Glumetza back to metformin. The Glumetza cost over $5200 a month which is too expensive and the insurance will not cover...Raechel Chute/lmb

## 2013-12-29 NOTE — Telephone Encounter (Signed)
OK Metformin XR generic Thx

## 2013-12-31 MED ORDER — METFORMIN HCL ER 500 MG PO TB24: 500.0000 mg | ORAL_TABLET | Freq: Two times a day (BID) | ORAL | Status: DC

## 2013-12-31 NOTE — Telephone Encounter (Signed)
Notified pt with md response.../lmb 

## 2014-01-31 ENCOUNTER — Telehealth: Payer: Self-pay

## 2014-01-31 NOTE — Telephone Encounter (Signed)
LVM for pt to call back.   RE: BP recheck via 5 min. nurse visit 

## 2014-02-10 ENCOUNTER — Ambulatory Visit: Payer: Managed Care, Other (non HMO) | Admitting: *Deleted

## 2014-02-10 ENCOUNTER — Telehealth: Payer: Self-pay | Admitting: *Deleted

## 2014-02-10 VITALS — BP 162/102

## 2014-02-10 DIAGNOSIS — I10 Essential (primary) hypertension: Secondary | ICD-10-CM

## 2014-02-10 MED ORDER — LOSARTAN POTASSIUM 100 MG PO TABS: 100.0000 mg | ORAL_TABLET | Freq: Every day | ORAL | Status: DC

## 2014-02-10 NOTE — Addendum Note (Signed)
Addended by: Tresa Garter on: 02/10/2014 04:49 PM   Modules accepted: Orders

## 2014-02-10 NOTE — Telephone Encounter (Signed)
Start Losartan 100 mg po qd ROV in 1 mo Thx

## 2014-02-11 ENCOUNTER — Telehealth: Payer: Self-pay | Admitting: *Deleted

## 2014-02-11 NOTE — Telephone Encounter (Signed)
F/U on BP recheck VM left for pt notifying them that Dr. Posey Rea has started a new medication and it has been sent to pt pharmacy for BP and that pt needs to set up an appointment to f/u on BP

## 2014-02-16 ENCOUNTER — Ambulatory Visit (INDEPENDENT_AMBULATORY_CARE_PROVIDER_SITE_OTHER): Payer: Managed Care, Other (non HMO) | Admitting: Licensed Clinical Social Worker

## 2014-02-16 DIAGNOSIS — F4323 Adjustment disorder with mixed anxiety and depressed mood: Secondary | ICD-10-CM

## 2014-02-27 ENCOUNTER — Other Ambulatory Visit: Payer: Managed Care, Other (non HMO)

## 2014-02-27 ENCOUNTER — Ambulatory Visit: Payer: Managed Care, Other (non HMO) | Admitting: Licensed Clinical Social Worker

## 2014-03-03 ENCOUNTER — Encounter: Payer: Self-pay | Admitting: Internal Medicine

## 2014-03-03 ENCOUNTER — Ambulatory Visit (INDEPENDENT_AMBULATORY_CARE_PROVIDER_SITE_OTHER): Payer: Managed Care, Other (non HMO) | Admitting: Internal Medicine

## 2014-03-03 VITALS — BP 140/80 | HR 74 | Temp 98.7°F | Wt 246.0 lb

## 2014-03-03 DIAGNOSIS — E119 Type 2 diabetes mellitus without complications: Secondary | ICD-10-CM

## 2014-03-03 DIAGNOSIS — F329 Major depressive disorder, single episode, unspecified: Secondary | ICD-10-CM

## 2014-03-03 DIAGNOSIS — I10 Essential (primary) hypertension: Secondary | ICD-10-CM

## 2014-03-03 DIAGNOSIS — E785 Hyperlipidemia, unspecified: Secondary | ICD-10-CM

## 2014-03-03 DIAGNOSIS — Z23 Encounter for immunization: Secondary | ICD-10-CM

## 2014-03-03 DIAGNOSIS — F32A Depression, unspecified: Secondary | ICD-10-CM

## 2014-03-03 NOTE — Assessment & Plan Note (Signed)
Continue with current prescription therapy as reflected on the Med list.  

## 2014-03-03 NOTE — Progress Notes (Deleted)
Pre visit review using our clinic review tool, if applicable. No additional management support is needed unless otherwise documented below in the visit note. 

## 2014-03-03 NOTE — Progress Notes (Signed)
   Subjective:   HPI    The patient needs to address  chronic hypertension that has been well controlled with medicines; to address chronic  hyperlipidemia controlled with medicines as well; and to address diabetes, controlled with medical treatment and diet.  BP Readings from Last 3 Encounters:  03/03/14 140/80  02/10/14 162/102  11/07/13 160/100   Wt Readings from Last 3 Encounters:  03/03/14 246 lb (111.585 kg)  11/07/13 235 lb (106.595 kg)  03/29/12 236 lb (107.049 kg)       Review of Systems  Constitutional: Negative for appetite change, fatigue and unexpected weight change.  HENT: Negative for congestion, nosebleeds, sneezing, sore throat and trouble swallowing.   Eyes: Negative for itching and visual disturbance.  Respiratory: Negative for cough.   Cardiovascular: Negative for chest pain, palpitations and leg swelling.  Gastrointestinal: Negative for nausea, diarrhea, blood in stool and abdominal distention.  Genitourinary: Negative for frequency and hematuria.  Musculoskeletal: Negative for back pain, gait problem, joint swelling and neck pain.  Skin: Negative for rash.  Neurological: Negative for dizziness, tremors, speech difficulty and weakness.  Psychiatric/Behavioral: Negative for sleep disturbance, dysphoric mood and agitation. The patient is not nervous/anxious.        Objective:   Physical Exam  Constitutional: He is oriented to person, place, and time. He appears well-developed.  Obese  HENT:  Mouth/Throat: Oropharynx is clear and moist.  Eyes: Conjunctivae are normal. Pupils are equal, round, and reactive to light.  Neck: Normal range of motion. No JVD present. No thyromegaly present.  Cardiovascular: Normal rate, regular rhythm and intact distal pulses.  Exam reveals no gallop and no friction rub.   Murmur (2/6) heard. Pulmonary/Chest: Effort normal and breath sounds normal. No respiratory distress. He has no wheezes. He has no rales. He exhibits no  tenderness.  Abdominal: Soft. Bowel sounds are normal. He exhibits no distension and no mass. There is no tenderness. There is no rebound and no guarding.  Musculoskeletal: Normal range of motion. He exhibits no edema and no tenderness.  Lymphadenopathy:    He has no cervical adenopathy.  Neurological: He is alert and oriented to person, place, and time. He has normal reflexes. No cranial nerve deficit. He exhibits normal muscle tone. Coordination normal.  Skin: Skin is warm and dry. No rash noted.  Psychiatric: He has a normal mood and affect. His behavior is normal. Judgment and thought content normal.      Lab Results  Component Value Date   WBC 6.8 04/26/2011   HGB 14.3 04/26/2011   HCT 41.5 04/26/2011   PLT 286.0 04/26/2011   GLUCOSE 102* 12/09/2013   CHOL 154 12/09/2013   TRIG 245.0* 12/09/2013   HDL 46.60 12/09/2013   LDLDIRECT 76.8 04/26/2011   LDLCALC 58 12/09/2013   ALT 49 12/09/2013   AST 29 12/09/2013   NA 137 12/09/2013   K 4.4 12/09/2013   CL 100 12/09/2013   CREATININE 0.8 12/09/2013   BUN 15 12/09/2013   CO2 25 12/09/2013   TSH 1.20 04/26/2011   PSA 0.73 04/26/2011   INR 1.09 06/16/2010   HGBA1C 6.9* 12/09/2013      Assessment & Plan:

## 2014-03-03 NOTE — Patient Instructions (Signed)
Normal BP<130/85 

## 2014-03-03 NOTE — Assessment & Plan Note (Addendum)
Better on Losartan, NAS diet. Check BP at home: call if elevated

## 2014-03-06 ENCOUNTER — Ambulatory Visit (INDEPENDENT_AMBULATORY_CARE_PROVIDER_SITE_OTHER): Payer: Managed Care, Other (non HMO) | Admitting: Licensed Clinical Social Worker

## 2014-03-06 DIAGNOSIS — F4323 Adjustment disorder with mixed anxiety and depressed mood: Secondary | ICD-10-CM

## 2014-03-13 ENCOUNTER — Ambulatory Visit: Payer: Managed Care, Other (non HMO) | Admitting: Licensed Clinical Social Worker

## 2014-04-13 ENCOUNTER — Ambulatory Visit (INDEPENDENT_AMBULATORY_CARE_PROVIDER_SITE_OTHER): Payer: Managed Care, Other (non HMO) | Admitting: Licensed Clinical Social Worker

## 2014-04-13 DIAGNOSIS — F4323 Adjustment disorder with mixed anxiety and depressed mood: Secondary | ICD-10-CM

## 2014-04-24 ENCOUNTER — Other Ambulatory Visit: Payer: Self-pay | Admitting: Internal Medicine

## 2014-04-24 ENCOUNTER — Ambulatory Visit (INDEPENDENT_AMBULATORY_CARE_PROVIDER_SITE_OTHER): Payer: Managed Care, Other (non HMO) | Admitting: Licensed Clinical Social Worker

## 2014-04-24 DIAGNOSIS — F4323 Adjustment disorder with mixed anxiety and depressed mood: Secondary | ICD-10-CM

## 2014-05-01 ENCOUNTER — Ambulatory Visit (INDEPENDENT_AMBULATORY_CARE_PROVIDER_SITE_OTHER): Payer: Managed Care, Other (non HMO) | Admitting: Licensed Clinical Social Worker

## 2014-05-01 DIAGNOSIS — F4323 Adjustment disorder with mixed anxiety and depressed mood: Secondary | ICD-10-CM

## 2014-07-06 ENCOUNTER — Ambulatory Visit: Payer: Managed Care, Other (non HMO) | Admitting: Internal Medicine

## 2014-07-22 ENCOUNTER — Telehealth: Payer: Self-pay | Admitting: Internal Medicine

## 2014-07-22 ENCOUNTER — Ambulatory Visit: Payer: Self-pay | Admitting: Internal Medicine

## 2014-07-22 NOTE — Telephone Encounter (Signed)
Ok Thx 

## 2014-07-22 NOTE — Telephone Encounter (Signed)
Patient a no show. Ok to reschedule  ? °

## 2014-08-05 ENCOUNTER — Encounter: Payer: Self-pay | Admitting: Internal Medicine

## 2014-08-05 ENCOUNTER — Ambulatory Visit (INDEPENDENT_AMBULATORY_CARE_PROVIDER_SITE_OTHER): Payer: Managed Care, Other (non HMO) | Admitting: Internal Medicine

## 2014-08-05 ENCOUNTER — Other Ambulatory Visit (INDEPENDENT_AMBULATORY_CARE_PROVIDER_SITE_OTHER): Payer: Managed Care, Other (non HMO)

## 2014-08-05 VITALS — BP 150/90 | HR 71 | Wt 250.0 lb

## 2014-08-05 DIAGNOSIS — F329 Major depressive disorder, single episode, unspecified: Secondary | ICD-10-CM

## 2014-08-05 DIAGNOSIS — I10 Essential (primary) hypertension: Secondary | ICD-10-CM

## 2014-08-05 DIAGNOSIS — E785 Hyperlipidemia, unspecified: Secondary | ICD-10-CM

## 2014-08-05 DIAGNOSIS — F32A Depression, unspecified: Secondary | ICD-10-CM

## 2014-08-05 DIAGNOSIS — E119 Type 2 diabetes mellitus without complications: Secondary | ICD-10-CM

## 2014-08-05 DIAGNOSIS — Z0000 Encounter for general adult medical examination without abnormal findings: Secondary | ICD-10-CM

## 2014-08-05 DIAGNOSIS — E669 Obesity, unspecified: Secondary | ICD-10-CM

## 2014-08-05 LAB — BASIC METABOLIC PANEL
BUN: 15 mg/dL (ref 6–23)
CALCIUM: 9.7 mg/dL (ref 8.4–10.5)
CHLORIDE: 100 meq/L (ref 96–112)
CO2: 26 mEq/L (ref 19–32)
CREATININE: 0.96 mg/dL (ref 0.40–1.50)
GFR: 84.97 mL/min (ref >60.00)
Glucose, Bld: 186 mg/dL — ABNORMAL HIGH (ref 70–99)
Potassium: 4.5 mEq/L (ref 3.5–5.1)
Sodium: 136 mEq/L (ref 135–145)

## 2014-08-05 LAB — HEMOGLOBIN A1C: Hgb A1c MFr Bld: 8.7 % — ABNORMAL HIGH (ref 4.6–6.5)

## 2014-08-05 NOTE — Assessment & Plan Note (Signed)
Colon vs Cologuard discussed No need for Hep B shots Hep C testing discussed Eye Exam q 12 mo

## 2014-08-05 NOTE — Assessment & Plan Note (Signed)
Labs Loose diet

## 2014-08-05 NOTE — Assessment & Plan Note (Signed)
On Wellbutrin, Lexapro

## 2014-08-05 NOTE — Assessment & Plan Note (Signed)
Worse Low carb diet 

## 2014-08-05 NOTE — Patient Instructions (Signed)
Low carb diet 

## 2014-08-05 NOTE — Assessment & Plan Note (Signed)
Loose wt 

## 2014-08-05 NOTE — Progress Notes (Signed)
   Subjective:   HPI    The patient needs to address  chronic hypertension that has been well controlled with medicines; to address chronic  hyperlipidemia controlled with medicines as well; and to address diabetes, controlled with medical treatment and diet.  BP Readings from Last 3 Encounters:  08/05/14 150/90  03/03/14 140/80  02/10/14 162/102   Wt Readings from Last 3 Encounters:  08/05/14 250 lb (113.399 kg)  03/03/14 246 lb (111.585 kg)  11/07/13 235 lb (106.595 kg)       Review of Systems  Constitutional: Negative for appetite change, fatigue and unexpected weight change.  HENT: Negative for congestion, nosebleeds, sneezing, sore throat and trouble swallowing.   Eyes: Negative for itching and visual disturbance.  Respiratory: Negative for cough.   Cardiovascular: Negative for chest pain, palpitations and leg swelling.  Gastrointestinal: Negative for nausea, diarrhea, blood in stool and abdominal distention.  Genitourinary: Negative for frequency and hematuria.  Musculoskeletal: Negative for back pain, joint swelling, gait problem and neck pain.  Skin: Negative for rash.  Neurological: Negative for dizziness, tremors, speech difficulty and weakness.  Psychiatric/Behavioral: Negative for sleep disturbance, dysphoric mood and agitation. The patient is not nervous/anxious.        Objective:   Physical Exam  Constitutional: He is oriented to person, place, and time. He appears well-developed.  Obese  HENT:  Mouth/Throat: Oropharynx is clear and moist.  Eyes: Conjunctivae are normal. Pupils are equal, round, and reactive to light.  Neck: Normal range of motion. No JVD present. No thyromegaly present.  Cardiovascular: Normal rate, regular rhythm and intact distal pulses.  Exam reveals no gallop and no friction rub.   Murmur (2/6) heard. Pulmonary/Chest: Effort normal and breath sounds normal. No respiratory distress. He has no wheezes. He has no rales. He exhibits no  tenderness.  Abdominal: Soft. Bowel sounds are normal. He exhibits no distension and no mass. There is no tenderness. There is no rebound and no guarding.  Musculoskeletal: Normal range of motion. He exhibits no edema or tenderness.  Lymphadenopathy:    He has no cervical adenopathy.  Neurological: He is alert and oriented to person, place, and time. He has normal reflexes. No cranial nerve deficit. He exhibits normal muscle tone. Coordination normal.  Skin: Skin is warm and dry. No rash noted.  Psychiatric: He has a normal mood and affect. His behavior is normal. Judgment and thought content normal.  Feet WNL    Lab Results  Component Value Date   WBC 6.8 04/26/2011   HGB 14.3 04/26/2011   HCT 41.5 04/26/2011   PLT 286.0 04/26/2011   GLUCOSE 102* 12/09/2013   CHOL 154 12/09/2013   TRIG 245.0* 12/09/2013   HDL 46.60 12/09/2013   LDLDIRECT 76.8 04/26/2011   LDLCALC 58 12/09/2013   ALT 49 12/09/2013   AST 29 12/09/2013   NA 137 12/09/2013   K 4.4 12/09/2013   CL 100 12/09/2013   CREATININE 0.8 12/09/2013   BUN 15 12/09/2013   CO2 25 12/09/2013   TSH 1.20 04/26/2011   PSA 0.73 04/26/2011   INR 1.09 06/16/2010   HGBA1C 6.9* 12/09/2013      Assessment & Plan:

## 2014-08-05 NOTE — Assessment & Plan Note (Signed)
On Lipitor 

## 2014-08-05 NOTE — Progress Notes (Signed)
Pre visit review using our clinic review tool, if applicable. No additional management support is needed unless otherwise documented below in the visit note. 

## 2014-08-07 ENCOUNTER — Other Ambulatory Visit: Payer: Self-pay | Admitting: Internal Medicine

## 2014-08-07 MED ORDER — EMPAGLIFLOZIN-LINAGLIPTIN 25-5 MG PO TABS: 1.0000 | ORAL_TABLET | Freq: Every morning | ORAL | Status: DC

## 2014-08-10 ENCOUNTER — Encounter: Payer: Self-pay | Admitting: Internal Medicine

## 2014-08-20 ENCOUNTER — Telehealth: Payer: Self-pay

## 2014-08-20 ENCOUNTER — Encounter: Payer: Self-pay | Admitting: Internal Medicine

## 2014-08-20 NOTE — Telephone Encounter (Signed)
Rec'd Fax from [employer] that Glyxambi 25mg  -5mg  qd was rejected; Formulary substitutes not available. Call placed to [employer] at [phone] for PA Per representative, Patient has taken Metformin 1000mg  and this was not tolerated. A1c is elevated.  Will send PA information to the pharmacy for approval or denial. Will fax this back to the office in 24 to 48 hour.

## 2014-08-21 NOTE — Telephone Encounter (Signed)
Per the [employer] PA representative; the mbr is on "step program" and requesting trial of other med as Invokamet; Januvia or Onglyza. We have not rec'd the fax as yet, but would you like to change rx or will re-submit request for appeal with further information.  Please advise.

## 2014-08-24 MED ORDER — CANAGLIFLOZIN-METFORMIN HCL 150-500 MG PO TABS: ORAL_TABLET | ORAL | Status: DC

## 2014-08-24 MED ORDER — SAXAGLIPTIN HCL 5 MG PO TABS: 5.0000 mg | ORAL_TABLET | Freq: Every day | ORAL | Status: DC

## 2014-08-24 NOTE — Telephone Encounter (Signed)
Ok to start Invokamet and Onglyza D/c Metformin, Glyxambi Thx

## 2014-08-24 NOTE — Telephone Encounter (Signed)
Left message for pt per dr plotnikovs instructions

## 2014-09-01 ENCOUNTER — Other Ambulatory Visit: Payer: Self-pay | Admitting: Internal Medicine

## 2014-10-15 ENCOUNTER — Other Ambulatory Visit: Payer: Self-pay | Admitting: Internal Medicine

## 2014-10-29 ENCOUNTER — Other Ambulatory Visit: Payer: Self-pay | Admitting: Internal Medicine

## 2014-11-15 ENCOUNTER — Other Ambulatory Visit: Payer: Self-pay | Admitting: Internal Medicine

## 2014-11-18 ENCOUNTER — Other Ambulatory Visit (INDEPENDENT_AMBULATORY_CARE_PROVIDER_SITE_OTHER): Payer: Managed Care, Other (non HMO)

## 2014-11-18 DIAGNOSIS — E119 Type 2 diabetes mellitus without complications: Secondary | ICD-10-CM

## 2014-11-18 DIAGNOSIS — I10 Essential (primary) hypertension: Secondary | ICD-10-CM

## 2014-11-18 LAB — BASIC METABOLIC PANEL
BUN: 24 mg/dL — ABNORMAL HIGH (ref 6–23)
CHLORIDE: 101 meq/L (ref 96–112)
CO2: 24 mEq/L (ref 19–32)
CREATININE: 1.06 mg/dL (ref 0.40–1.50)
Calcium: 9.9 mg/dL (ref 8.4–10.5)
GFR: 75.72 mL/min (ref >60.00)
Glucose, Bld: 128 mg/dL — ABNORMAL HIGH (ref 70–99)
Potassium: 4.6 mEq/L (ref 3.5–5.1)
SODIUM: 137 meq/L (ref 135–145)

## 2014-11-18 LAB — HEMOGLOBIN A1C: Hgb A1c MFr Bld: 6.5 % (ref 4.6–6.5)

## 2014-12-07 ENCOUNTER — Ambulatory Visit (INDEPENDENT_AMBULATORY_CARE_PROVIDER_SITE_OTHER): Payer: Managed Care, Other (non HMO) | Admitting: Internal Medicine

## 2014-12-07 ENCOUNTER — Encounter: Payer: Self-pay | Admitting: Internal Medicine

## 2014-12-07 VITALS — BP 136/98 | HR 70 | Wt 244.0 lb

## 2014-12-07 DIAGNOSIS — E669 Obesity, unspecified: Secondary | ICD-10-CM | POA: Diagnosis not present

## 2014-12-07 DIAGNOSIS — F329 Major depressive disorder, single episode, unspecified: Secondary | ICD-10-CM | POA: Diagnosis not present

## 2014-12-07 DIAGNOSIS — E119 Type 2 diabetes mellitus without complications: Secondary | ICD-10-CM

## 2014-12-07 DIAGNOSIS — F32A Depression, unspecified: Secondary | ICD-10-CM

## 2014-12-07 DIAGNOSIS — I10 Essential (primary) hypertension: Secondary | ICD-10-CM

## 2014-12-07 MED ORDER — ATORVASTATIN CALCIUM 40 MG PO TABS: 40.0000 mg | ORAL_TABLET | Freq: Every day | ORAL | Status: DC

## 2014-12-07 NOTE — Assessment & Plan Note (Signed)
On Losartan 

## 2014-12-07 NOTE — Progress Notes (Signed)
Pre visit review using our clinic review tool, if applicable. No additional management support is needed unless otherwise documented below in the visit note. 

## 2014-12-07 NOTE — Progress Notes (Addendum)
Subjective:  Patient ID: Roberto Swanson, male    DOB: 06-07-1955  Age: 59 y.o. MRN: [id number]  CC: No chief complaint on file.   HPI Roberto Swanson presents for DM, dyslipidemia, depression f/up  Outpatient Prescriptions Prior to Visit  Medication Sig Dispense Refill  . aspirin 325 MG EC tablet Take 325 mg by mouth daily.    Marland Kitchen. atorvastatin (LIPITOR) 40 MG tablet Take 1 tablet (40 mg total) by mouth daily. 30 tablet 11  . b complex vitamins tablet Take 1 tablet by mouth daily.      Marland Kitchen. buPROPion (WELLBUTRIN SR) 150 MG 12 hr tablet TAKE 1 TABLET (150 MG TOTAL) BY MOUTH 2 (TWO) TIMES DAILY. 60 tablet 11  . Canagliflozin-Metformin HCl (INVOKAMET) 150-500 MG TABS 1 po bid 60 tablet 11  . cholecalciferol (VITAMIN D) 1000 UNITS tablet Take 1,000 Units by mouth daily.      Marland Kitchen. escitalopram (LEXAPRO) 20 MG tablet TAKE 1 TABLET (20 MG TOTAL) BY MOUTH DAILY. 30 tablet 11  . losartan (COZAAR) 100 MG tablet Take 1 tablet (100 mg total) by mouth daily. 30 tablet 11  . saxagliptin HCl (ONGLYZA) 5 MG TABS tablet Take 1 tablet (5 mg total) by mouth daily. 30 tablet 11  . triamcinolone cream (KENALOG) 0.1 % APPLY TOPICALLY 2 (TWO) TIMES DAILY. 45 g 3   No facility-administered medications prior to visit.    ROS Review of Systems  Constitutional: Negative for appetite change, fatigue and unexpected weight change.  HENT: Negative for congestion, nosebleeds, sneezing, sore throat and trouble swallowing.   Eyes: Negative for itching and visual disturbance.  Respiratory: Negative for cough.   Cardiovascular: Negative for chest pain, palpitations and leg swelling.  Gastrointestinal: Negative for nausea, diarrhea, blood in stool and abdominal distention.  Genitourinary: Negative for frequency and hematuria.  Musculoskeletal: Positive for arthralgias. Negative for back pain, joint swelling, gait problem and neck pain.  Skin: Negative for rash.  Neurological: Negative for dizziness, tremors, speech  difficulty and weakness.  Psychiatric/Behavioral: Negative for sleep disturbance, dysphoric mood and agitation. The patient is not nervous/anxious.     Objective:  BP 136/98 mmHg  Pulse 70  Wt 244 lb (110.678 kg)  SpO2 97%  BP Readings from Last 3 Encounters:  12/07/14 136/98  08/05/14 150/90  03/03/14 140/80    Wt Readings from Last 3 Encounters:  12/07/14 244 lb (110.678 kg)  08/05/14 250 lb (113.399 kg)  03/03/14 246 lb (111.585 kg)    Physical Exam  Constitutional: He is oriented to person, place, and time. He appears well-developed. No distress.  NAD  HENT:  Mouth/Throat: Oropharynx is clear and moist.  Eyes: Conjunctivae are normal. Pupils are equal, round, and reactive to light.  Neck: Normal range of motion. No JVD present. No thyromegaly present.  Cardiovascular: Normal rate, regular rhythm, normal heart sounds and intact distal pulses.  Exam reveals no gallop and no friction rub.   No murmur heard. Pulmonary/Chest: Effort normal and breath sounds normal. No respiratory distress. He has no wheezes. He has no rales. He exhibits no tenderness.  Abdominal: Soft. Bowel sounds are normal. He exhibits no distension and no mass. There is no tenderness. There is no rebound and no guarding.  Musculoskeletal: Normal range of motion. He exhibits no edema or tenderness.  Lymphadenopathy:    He has no cervical adenopathy.  Neurological: He is alert and oriented to person, place, and time. He has normal reflexes. No cranial nerve deficit. He exhibits normal  muscle tone. He displays a negative Romberg sign. Coordination and gait normal.  Skin: Skin is warm and dry. No rash noted.  Psychiatric: He has a normal mood and affect. His behavior is normal. Judgment and thought content normal.  L shin w/lat scar  Lab Results  Component Value Date   WBC 6.8 04/26/2011   HGB 14.3 04/26/2011   HCT 41.5 04/26/2011   PLT 286.0 04/26/2011   GLUCOSE 128* 11/18/2014   CHOL 154 12/09/2013    TRIG 245.0* 12/09/2013   HDL 46.60 12/09/2013   LDLDIRECT 76.8 04/26/2011   LDLCALC 58 12/09/2013   ALT 49 12/09/2013   AST 29 12/09/2013   NA 137 11/18/2014   K 4.6 11/18/2014   CL 101 11/18/2014   CREATININE 1.06 11/18/2014   BUN 24* 11/18/2014   CO2 24 11/18/2014   TSH 1.20 04/26/2011   PSA 0.73 04/26/2011   INR 1.09 06/16/2010   HGBA1C 6.5 11/18/2014    Dg Pelvis Portable  06/14/2010   Clinical Data: Postoperative for right total hip arthroplasty.   PORTABLE PELVIS   Comparison: 03/15/2005   Findings: The right hip prosthesis is in place with expected alignment and without fracture or complicating feature identified. A small amount of gas is present in the adjacent soft tissues of the upper thigh.   There is spurring of the left acetabulum with mild loss of articular space in the left hip.   IMPRESSION:   1.  Right hip prosthesis in expected position without fracture or complicating feature identified.  Provider: Sherene Sires, Earlean Polka  Dg Hip Portable 1 View Right  06/14/2010   Clinical Data: Postoperative right total hip prosthesis.   PORTABLE RIGHT HIP - 1 VIEW   Comparison: None.   Findings: On cross-table lateral projection, the right hip prosthesis appears unremarkable.  No dislocation, fracture, or complicating feature noted.   IMPRESSION:   1.  Right total hip prosthesis without complicating feature.  Provider: Sherene Sires, Earlean Polka   Assessment & Plan:   Diagnoses and all orders for this visit:  HYPERTENSION  Type 2 diabetes mellitus without complication  Chronic depression  Obesity  Other orders -     atorvastatin (LIPITOR) 40 MG tablet; Take 1 tablet (40 mg total) by mouth daily.  I am having Roberto Swanson maintain his cholecalciferol, b complex vitamins, atorvastatin, losartan, aspirin, saxagliptin HCl, Canagliflozin-Metformin HCl, triamcinolone cream, escitalopram, buPROPion, and metFORMIN.  Meds ordered this encounter  Medications  .  metFORMIN (GLUCOPHAGE-XR) 500 MG 24 hr tablet    Sig: Take 500 mg by mouth 2 (two) times daily.    Refill:  2     Follow-up: No Follow-up on file.  Sonda Primes, MD

## 2014-12-07 NOTE — Assessment & Plan Note (Addendum)
On Onglyza, Invokamet 

## 2014-12-07 NOTE — Assessment & Plan Note (Signed)
On Wellbutrin, Lexapro - ok to reduce to 1/2 Lexapro

## 2014-12-07 NOTE — Assessment & Plan Note (Signed)
Wt Readings from Last 3 Encounters:  12/07/14 244 lb (110.678 kg)  08/05/14 250 lb (113.399 kg)  03/03/14 246 lb (111.585 kg)

## 2014-12-26 ENCOUNTER — Other Ambulatory Visit: Payer: Self-pay | Admitting: Internal Medicine

## 2015-01-05 ENCOUNTER — Other Ambulatory Visit: Payer: Self-pay | Admitting: Internal Medicine

## 2015-02-03 ENCOUNTER — Other Ambulatory Visit: Payer: Self-pay | Admitting: Internal Medicine

## 2015-04-06 ENCOUNTER — Other Ambulatory Visit (INDEPENDENT_AMBULATORY_CARE_PROVIDER_SITE_OTHER): Payer: Managed Care, Other (non HMO)

## 2015-04-06 DIAGNOSIS — E119 Type 2 diabetes mellitus without complications: Secondary | ICD-10-CM

## 2015-04-06 DIAGNOSIS — I10 Essential (primary) hypertension: Secondary | ICD-10-CM | POA: Diagnosis not present

## 2015-04-06 LAB — BASIC METABOLIC PANEL
BUN: 15 mg/dL (ref 6–23)
CO2: 27 mEq/L (ref 19–32)
Calcium: 9.7 mg/dL (ref 8.4–10.5)
Chloride: 101 mEq/L (ref 96–112)
Creatinine, Ser: 0.86 mg/dL (ref 0.40–1.50)
GFR: 96.26 mL/min (ref >60.00)
Glucose, Bld: 121 mg/dL — ABNORMAL HIGH (ref 70–99)
POTASSIUM: 4.1 meq/L (ref 3.5–5.1)
SODIUM: 138 meq/L (ref 135–145)

## 2015-04-06 LAB — HEMOGLOBIN A1C: HEMOGLOBIN A1C: 6.4 % (ref 4.6–6.5)

## 2015-04-09 ENCOUNTER — Ambulatory Visit (INDEPENDENT_AMBULATORY_CARE_PROVIDER_SITE_OTHER): Payer: Managed Care, Other (non HMO) | Admitting: Internal Medicine

## 2015-04-09 ENCOUNTER — Encounter: Payer: Self-pay | Admitting: Internal Medicine

## 2015-04-09 VITALS — BP 130/82 | HR 71 | Wt 243.0 lb

## 2015-04-09 DIAGNOSIS — E785 Hyperlipidemia, unspecified: Secondary | ICD-10-CM

## 2015-04-09 DIAGNOSIS — I10 Essential (primary) hypertension: Secondary | ICD-10-CM | POA: Diagnosis not present

## 2015-04-09 DIAGNOSIS — E119 Type 2 diabetes mellitus without complications: Secondary | ICD-10-CM | POA: Diagnosis not present

## 2015-04-09 NOTE — Assessment & Plan Note (Signed)
On Losartan 

## 2015-04-09 NOTE — Progress Notes (Signed)
Subjective:  Patient ID: Roberto Swanson, male    DOB: 12-13-1955  Age: 59 y.o. MRN: [id number]  CC: No chief complaint on file.   HPI WOOD NOVACEK presents for HTN, depression, DM, dyslipidemia f/u  Outpatient Prescriptions Prior to Visit  Medication Sig Dispense Refill  . aspirin 325 MG EC tablet Take 325 mg by mouth daily.    Marland Kitchen atorvastatin (LIPITOR) 40 MG tablet Take 1 tablet (40 mg total) by mouth daily. 30 tablet 11  . b complex vitamins tablet Take 1 tablet by mouth daily.      Marland Kitchen buPROPion (WELLBUTRIN SR) 150 MG 12 hr tablet TAKE 1 TABLET (150 MG TOTAL) BY MOUTH 2 (TWO) TIMES DAILY. 60 tablet 11  . Canagliflozin-Metformin HCl (INVOKAMET) 150-500 MG TABS 1 po bid 60 tablet 11  . cholecalciferol (VITAMIN D) 1000 UNITS tablet Take 1,000 Units by mouth daily.      Marland Kitchen escitalopram (LEXAPRO) 20 MG tablet TAKE 1 TABLET (20 MG TOTAL) BY MOUTH DAILY. 30 tablet 11  . losartan (COZAAR) 100 MG tablet TAKE 1 TABLET (100 MG TOTAL) BY MOUTH DAILY. 30 tablet 11  . metFORMIN (GLUCOPHAGE-XR) 500 MG 24 hr tablet Take 500 mg by mouth 2 (two) times daily.  2  . saxagliptin HCl (ONGLYZA) 5 MG TABS tablet Take 1 tablet (5 mg total) by mouth daily. 30 tablet 11  . triamcinolone cream (KENALOG) 0.1 % APPLY TOPICALLY 2 (TWO) TIMES DAILY. 45 g 3  . metFORMIN (GLUCOPHAGE-XR) 500 MG 24 hr tablet TAKE 1 TABLET BY MOUTH TWICE A DAY (Patient not taking: Reported on 04/09/2015) 180 tablet 3   No facility-administered medications prior to visit.    ROS Review of Systems  Constitutional: Negative for appetite change, fatigue and unexpected weight change.  HENT: Negative for congestion, nosebleeds, sneezing, sore throat and trouble swallowing.   Eyes: Negative for itching and visual disturbance.  Respiratory: Negative for cough.   Cardiovascular: Negative for chest pain, palpitations and leg swelling.  Gastrointestinal: Negative for nausea, diarrhea, blood in stool and abdominal distention.    Genitourinary: Negative for frequency and hematuria.  Musculoskeletal: Negative for back pain, joint swelling, gait problem and neck pain.  Skin: Negative for rash.  Neurological: Negative for dizziness, tremors, speech difficulty and weakness.  Psychiatric/Behavioral: Negative for sleep disturbance, dysphoric mood and agitation. The patient is not nervous/anxious.     Objective:  BP 130/82 mmHg  Pulse 71  Wt 243 lb (110.224 kg)  SpO2 94%  BP Readings from Last 3 Encounters:  04/09/15 130/82  12/07/14 136/98  08/05/14 150/90    Wt Readings from Last 3 Encounters:  04/09/15 243 lb (110.224 kg)  12/07/14 244 lb (110.678 kg)  08/05/14 250 lb (113.399 kg)    Physical Exam  Constitutional: He is oriented to person, place, and time. He appears well-developed. No distress.  NAD  HENT:  Mouth/Throat: Oropharynx is clear and moist.  Eyes: Conjunctivae are normal. Pupils are equal, round, and reactive to light.  Neck: Normal range of motion. No JVD present. No thyromegaly present.  Cardiovascular: Normal rate, regular rhythm, normal heart sounds and intact distal pulses.  Exam reveals no gallop and no friction rub.   No murmur heard. Pulmonary/Chest: Effort normal and breath sounds normal. No respiratory distress. He has no wheezes. He has no rales. He exhibits no tenderness.  Abdominal: Soft. Bowel sounds are normal. He exhibits no distension and no mass. There is no tenderness. There is no rebound and no guarding.  Musculoskeletal: Normal range of motion. He exhibits no edema or tenderness.  Lymphadenopathy:    He has no cervical adenopathy.  Neurological: He is alert and oriented to person, place, and time. He has normal reflexes. No cranial nerve deficit. He exhibits normal muscle tone. He displays a negative Romberg sign. Coordination and gait normal.  Skin: Skin is warm and dry. No rash noted.  Psychiatric: He has a normal mood and affect. His behavior is normal. Judgment and  thought content normal.    Lab Results  Component Value Date   WBC 6.8 04/26/2011   HGB 14.3 04/26/2011   HCT 41.5 04/26/2011   PLT 286.0 04/26/2011   GLUCOSE 121* 04/06/2015   CHOL 154 12/09/2013   TRIG 245.0* 12/09/2013   HDL 46.60 12/09/2013   LDLDIRECT 76.8 04/26/2011   LDLCALC 58 12/09/2013   ALT 49 12/09/2013   AST 29 12/09/2013   NA 138 04/06/2015   K 4.1 04/06/2015   CL 101 04/06/2015   CREATININE 0.86 04/06/2015   BUN 15 04/06/2015   CO2 27 04/06/2015   TSH 1.20 04/26/2011   PSA 0.73 04/26/2011   INR 1.09 06/16/2010   HGBA1C 6.4 04/06/2015    Dg Pelvis Portable  06/14/2010  Clinical Data: Postoperative for right total hip arthroplasty.  PORTABLE PELVIS  Comparison: 03/15/2005  Findings: The right hip prosthesis is in place with expected alignment and without fracture or complicating feature identified. A small amount of gas is present in the adjacent soft tissues of the upper thigh.  There is spurring of the left acetabulum with mild loss of articular space in the left hip.  IMPRESSION:  1.  Right hip prosthesis in expected position without fracture or complicating feature identified. Provider: Sherene SiresStephanie Clarke, Earlean PolkaWhitney Parks  Dg Hip Portable 1 View Right  06/14/2010  Clinical Data: Postoperative right total hip prosthesis.  PORTABLE RIGHT HIP - 1 VIEW  Comparison: None.  Findings: On cross-table lateral projection, the right hip prosthesis appears unremarkable.  No dislocation, fracture, or complicating feature noted.  IMPRESSION:  1.  Right total hip prosthesis without complicating feature. Provider: Sherene SiresStephanie Clarke, Earlean PolkaWhitney Parks   Assessment & Plan:   Diagnoses and all orders for this visit:  Type 2 diabetes mellitus without complication, without long-term current use of insulin (HCC) -     Hepatitis C antibody; Future -     Basic metabolic panel; Future -     Hemoglobin A1c; Future  HYPERTENSION -     Hepatitis C antibody; Future -     Basic metabolic  panel; Future -     Hemoglobin A1c; Future  Dyslipidemia -     Hepatitis C antibody; Future -     Basic metabolic panel; Future -     Hemoglobin A1c; Future  I am having [name] maintain his cholecalciferol, b complex vitamins, aspirin, saxagliptin HCl, Canagliflozin-Metformin HCl, escitalopram, buPROPion, metFORMIN, atorvastatin, losartan, and triamcinolone cream.  No orders of the defined types were placed in this encounter.     Follow-up: Return in about 4 months (around 08/07/2015) for a follow-up visit.  Sonda PrimesAlex Plotnikov, MD

## 2015-04-09 NOTE — Assessment & Plan Note (Signed)
On Lipitor 

## 2015-04-09 NOTE — Progress Notes (Signed)
Pre visit review using our clinic review tool, if applicable. No additional management support is needed unless otherwise documented below in the visit note. 

## 2015-04-09 NOTE — Assessment & Plan Note (Signed)
On Onglyza, Invokamet 

## 2015-05-01 LAB — COLOGUARD: Cologuard: NEGATIVE

## 2015-05-06 ENCOUNTER — Telehealth: Payer: Self-pay | Admitting: *Deleted

## 2015-05-06 NOTE — Telephone Encounter (Signed)
I called pt to inform him of negative Cologuard colon cancer screening is negative. No answer. Left detailed mess informing pt.

## 2015-08-04 ENCOUNTER — Other Ambulatory Visit (INDEPENDENT_AMBULATORY_CARE_PROVIDER_SITE_OTHER): Payer: BLUE CROSS/BLUE SHIELD

## 2015-08-04 DIAGNOSIS — E785 Hyperlipidemia, unspecified: Secondary | ICD-10-CM

## 2015-08-04 DIAGNOSIS — E119 Type 2 diabetes mellitus without complications: Secondary | ICD-10-CM | POA: Diagnosis not present

## 2015-08-04 DIAGNOSIS — I10 Essential (primary) hypertension: Secondary | ICD-10-CM

## 2015-08-04 LAB — BASIC METABOLIC PANEL
BUN: 18 mg/dL (ref 6–23)
CALCIUM: 9.5 mg/dL (ref 8.4–10.5)
CHLORIDE: 101 meq/L (ref 96–112)
CO2: 23 mEq/L (ref 19–32)
CREATININE: 0.93 mg/dL (ref 0.40–1.50)
GFR: 87.85 mL/min (ref >60.00)
Glucose, Bld: 170 mg/dL — ABNORMAL HIGH (ref 70–99)
Potassium: 4.1 mEq/L (ref 3.5–5.1)
SODIUM: 138 meq/L (ref 135–145)

## 2015-08-04 LAB — HEMOGLOBIN A1C: HEMOGLOBIN A1C: 6.7 % — AB (ref 4.6–6.5)

## 2015-08-05 LAB — HEPATITIS C ANTIBODY: HCV Ab: NEGATIVE

## 2015-08-09 ENCOUNTER — Encounter: Payer: Self-pay | Admitting: Internal Medicine

## 2015-08-09 ENCOUNTER — Ambulatory Visit (INDEPENDENT_AMBULATORY_CARE_PROVIDER_SITE_OTHER): Payer: BLUE CROSS/BLUE SHIELD | Admitting: Internal Medicine

## 2015-08-09 VITALS — BP 140/84 | HR 76 | Wt 250.0 lb

## 2015-08-09 DIAGNOSIS — I10 Essential (primary) hypertension: Secondary | ICD-10-CM | POA: Diagnosis not present

## 2015-08-09 DIAGNOSIS — F32A Depression, unspecified: Secondary | ICD-10-CM

## 2015-08-09 DIAGNOSIS — E119 Type 2 diabetes mellitus without complications: Secondary | ICD-10-CM

## 2015-08-09 DIAGNOSIS — F329 Major depressive disorder, single episode, unspecified: Secondary | ICD-10-CM | POA: Diagnosis not present

## 2015-08-09 DIAGNOSIS — E785 Hyperlipidemia, unspecified: Secondary | ICD-10-CM

## 2015-08-09 MED ORDER — CANAGLIFLOZIN-METFORMIN HCL 150-1000 MG PO TABS: ORAL_TABLET | ORAL | Status: DC

## 2015-08-09 NOTE — Progress Notes (Signed)
Pre visit review using our clinic review tool, if applicable. No additional management support is needed unless otherwise documented below in the visit note. 

## 2015-08-09 NOTE — Assessment & Plan Note (Signed)
On Onglyza, Invokamet 

## 2015-08-09 NOTE — Assessment & Plan Note (Signed)
  On Wellbutrin, Lexapro

## 2015-08-09 NOTE — Assessment & Plan Note (Signed)
States BP is nl at home On Losartan Loose wt

## 2015-08-09 NOTE — Progress Notes (Signed)
Subjective:  Patient ID: Roberto Swanson, male    DOB: 01/11/1956  Age: 60 y.o. MRN: [id number]  CC: No chief complaint on file.   HPI Roberto Swanson presents for HTN, DM2, dyslipidemia f/u. BP is good at home  Outpatient Prescriptions Prior to Visit  Medication Sig Dispense Refill  . aspirin 325 MG EC tablet Take 325 mg by mouth daily.    Marland Kitchen atorvastatin (LIPITOR) 40 MG tablet Take 1 tablet (40 mg total) by mouth daily. 30 tablet 11  . b complex vitamins tablet Take 1 tablet by mouth daily.      Marland Kitchen buPROPion (WELLBUTRIN SR) 150 MG 12 hr tablet TAKE 1 TABLET (150 MG TOTAL) BY MOUTH 2 (TWO) TIMES DAILY. 60 tablet 11  . Canagliflozin-Metformin HCl (INVOKAMET) 150-500 MG TABS 1 po bid 60 tablet 11  . cholecalciferol (VITAMIN D) 1000 UNITS tablet Take 1,000 Units by mouth daily.      Marland Kitchen escitalopram (LEXAPRO) 20 MG tablet TAKE 1 TABLET (20 MG TOTAL) BY MOUTH DAILY. 30 tablet 11  . losartan (COZAAR) 100 MG tablet TAKE 1 TABLET (100 MG TOTAL) BY MOUTH DAILY. 30 tablet 11  . metFORMIN (GLUCOPHAGE-XR) 500 MG 24 hr tablet Take 500 mg by mouth 2 (two) times daily.  2  . saxagliptin HCl (ONGLYZA) 5 MG TABS tablet Take 1 tablet (5 mg total) by mouth daily. 30 tablet 11  . triamcinolone cream (KENALOG) 0.1 % APPLY TOPICALLY 2 (TWO) TIMES DAILY. 45 g 3   No facility-administered medications prior to visit.    ROS Review of Systems  Constitutional: Negative for appetite change, fatigue and unexpected weight change.  HENT: Negative for congestion, nosebleeds, sneezing, sore throat and trouble swallowing.   Eyes: Negative for itching and visual disturbance.  Respiratory: Negative for cough.   Cardiovascular: Negative for chest pain, palpitations and leg swelling.  Gastrointestinal: Negative for nausea, diarrhea, blood in stool and abdominal distention.  Genitourinary: Negative for frequency and hematuria.  Musculoskeletal: Negative for back pain, joint swelling, gait problem and neck pain.    Skin: Negative for rash.  Neurological: Negative for dizziness, tremors, speech difficulty and weakness.  Psychiatric/Behavioral: Negative for suicidal ideas, sleep disturbance, dysphoric mood and agitation. The patient is not nervous/anxious.     Objective:  BP 140/84 mmHg  Pulse 76  Wt 250 lb (113.399 kg)  SpO2 94%  BP Readings from Last 3 Encounters:  08/09/15 140/84  04/09/15 130/82  12/07/14 136/98    Wt Readings from Last 3 Encounters:  08/09/15 250 lb (113.399 kg)  04/09/15 243 lb (110.224 kg)  12/07/14 244 lb (110.678 kg)    Physical Exam  Constitutional: He is oriented to person, place, and time. He appears well-developed. No distress.  NAD  HENT:  Mouth/Throat: Oropharynx is clear and moist.  Eyes: Conjunctivae are normal. Pupils are equal, round, and reactive to light.  Neck: Normal range of motion. No JVD present. No thyromegaly present.  Cardiovascular: Normal rate, regular rhythm, normal heart sounds and intact distal pulses.  Exam reveals no gallop and no friction rub.   No murmur heard. Pulmonary/Chest: Effort normal and breath sounds normal. No respiratory distress. He has no wheezes. He has no rales. He exhibits no tenderness.  Abdominal: Soft. Bowel sounds are normal. He exhibits no distension and no mass. There is no tenderness. There is no rebound and no guarding.  Musculoskeletal: Normal range of motion. He exhibits no edema or tenderness.  Lymphadenopathy:    He has no  cervical adenopathy.  Neurological: He is alert and oriented to person, place, and time. He has normal reflexes. No cranial nerve deficit. He exhibits normal muscle tone. He displays a negative Romberg sign. Coordination and gait normal.  Skin: Skin is warm and dry. No rash noted.  Psychiatric: He has a normal mood and affect. His behavior is normal. Judgment and thought content normal.    Lab Results  Component Value Date   WBC 6.8 04/26/2011   HGB 14.3 04/26/2011   HCT 41.5  04/26/2011   PLT 286.0 04/26/2011   GLUCOSE 170* 08/04/2015   CHOL 154 12/09/2013   TRIG 245.0* 12/09/2013   HDL 46.60 12/09/2013   LDLDIRECT 76.8 04/26/2011   LDLCALC 58 12/09/2013   ALT 49 12/09/2013   AST 29 12/09/2013   NA 138 08/04/2015   K 4.1 08/04/2015   CL 101 08/04/2015   CREATININE 0.93 08/04/2015   BUN 18 08/04/2015   CO2 23 08/04/2015   TSH 1.20 04/26/2011   PSA 0.73 04/26/2011   INR 1.09 06/16/2010   HGBA1C 6.7* 08/04/2015    Dg Pelvis Portable  06/14/2010  Clinical Data: Postoperative for right total hip arthroplasty.  PORTABLE PELVIS  Comparison: 03/15/2005  Findings: The right hip prosthesis is in place with expected alignment and without fracture or complicating feature identified. A small amount of gas is present in the adjacent soft tissues of the upper thigh.  There is spurring of the left acetabulum with mild loss of articular space in the left hip.  IMPRESSION:  1.  Right hip prosthesis in expected position without fracture or complicating feature identified. Provider: Sherene SiresStephanie Clarke, Earlean PolkaWhitney Parks  Dg Hip Portable 1 View Right  06/14/2010  Clinical Data: Postoperative right total hip prosthesis.  PORTABLE RIGHT HIP - 1 VIEW  Comparison: None.  Findings: On cross-table lateral projection, the right hip prosthesis appears unremarkable.  No dislocation, fracture, or complicating feature noted.  IMPRESSION:  1.  Right total hip prosthesis without complicating feature. Provider: Sherene SiresStephanie Clarke, Earlean PolkaWhitney Parks   Assessment & Plan:   There are no diagnoses linked to this encounter. I am having Roberto Swanson maintain his cholecalciferol, b complex vitamins, aspirin, saxagliptin HCl, Canagliflozin-Metformin HCl, escitalopram, buPROPion, metFORMIN, atorvastatin, losartan, and triamcinolone cream.  No orders of the defined types were placed in this encounter.     Follow-up: No Follow-up on file.  Sonda PrimesAlex Plotnikov, MD

## 2015-08-09 NOTE — Assessment & Plan Note (Signed)
On Lipitor 

## 2015-08-10 ENCOUNTER — Other Ambulatory Visit: Payer: Self-pay

## 2015-08-10 MED ORDER — SAXAGLIPTIN HCL 5 MG PO TABS: 5.0000 mg | ORAL_TABLET | Freq: Every day | ORAL | Status: DC

## 2015-10-31 ENCOUNTER — Other Ambulatory Visit: Payer: Self-pay | Admitting: Internal Medicine

## 2015-11-25 ENCOUNTER — Other Ambulatory Visit: Payer: Self-pay | Admitting: Internal Medicine

## 2015-12-06 ENCOUNTER — Other Ambulatory Visit: Payer: Self-pay | Admitting: Internal Medicine

## 2015-12-09 ENCOUNTER — Ambulatory Visit: Payer: BLUE CROSS/BLUE SHIELD | Admitting: Internal Medicine

## 2015-12-09 ENCOUNTER — Other Ambulatory Visit (INDEPENDENT_AMBULATORY_CARE_PROVIDER_SITE_OTHER): Payer: BLUE CROSS/BLUE SHIELD

## 2015-12-09 DIAGNOSIS — E119 Type 2 diabetes mellitus without complications: Secondary | ICD-10-CM | POA: Diagnosis not present

## 2015-12-09 LAB — BASIC METABOLIC PANEL
BUN: 23 mg/dL (ref 6–23)
CO2: 24 mEq/L (ref 19–32)
Calcium: 9.6 mg/dL (ref 8.4–10.5)
Chloride: 100 mEq/L (ref 96–112)
Creatinine, Ser: 1.14 mg/dL (ref 0.40–1.50)
GFR: 69.37 mL/min (ref >60.00)
GLUCOSE: 128 mg/dL — AB (ref 70–99)
POTASSIUM: 4.2 meq/L (ref 3.5–5.1)
SODIUM: 137 meq/L (ref 135–145)

## 2015-12-09 LAB — HEMOGLOBIN A1C: Hgb A1c MFr Bld: 6.4 % (ref 4.6–6.5)

## 2015-12-13 ENCOUNTER — Ambulatory Visit: Payer: BLUE CROSS/BLUE SHIELD | Admitting: Internal Medicine

## 2015-12-28 ENCOUNTER — Ambulatory Visit (INDEPENDENT_AMBULATORY_CARE_PROVIDER_SITE_OTHER): Payer: BLUE CROSS/BLUE SHIELD | Admitting: Internal Medicine

## 2015-12-28 ENCOUNTER — Encounter: Payer: Self-pay | Admitting: Internal Medicine

## 2015-12-28 DIAGNOSIS — E785 Hyperlipidemia, unspecified: Secondary | ICD-10-CM

## 2015-12-28 DIAGNOSIS — E119 Type 2 diabetes mellitus without complications: Secondary | ICD-10-CM | POA: Diagnosis not present

## 2015-12-28 DIAGNOSIS — I10 Essential (primary) hypertension: Secondary | ICD-10-CM | POA: Diagnosis not present

## 2015-12-28 DIAGNOSIS — M1732 Unilateral post-traumatic osteoarthritis, left knee: Secondary | ICD-10-CM

## 2015-12-28 DIAGNOSIS — E669 Obesity, unspecified: Secondary | ICD-10-CM

## 2015-12-28 NOTE — Assessment & Plan Note (Signed)
Lost wt °

## 2015-12-28 NOTE — Assessment & Plan Note (Signed)
On Onglyza, Invokamet Better

## 2015-12-28 NOTE — Progress Notes (Signed)
Pre visit review using our clinic review tool, if applicable. No additional management support is needed unless otherwise documented below in the visit note. 

## 2015-12-28 NOTE — Assessment & Plan Note (Signed)
On Losartan 

## 2015-12-28 NOTE — Assessment & Plan Note (Signed)
On Lipitor 

## 2015-12-28 NOTE — Progress Notes (Signed)
Subjective:  Patient ID: Roberto Swanson, male    DOB: 1955/08/08  Age: 60 y.o. MRN: [id number]  CC: No chief complaint on file.   HPI MYNOR WITKOP presents for DM, HTN, depression f/u  Outpatient Medications Prior to Visit  Medication Sig Dispense Refill  . aspirin 325 MG EC tablet Take 325 mg by mouth daily.    Marland Kitchen atorvastatin (LIPITOR) 40 MG tablet TAKE 1 TABLET BY MOUTH EVERY DAY 90 tablet 1  . b complex vitamins tablet Take 1 tablet by mouth daily.      Marland Kitchen buPROPion (WELLBUTRIN SR) 150 MG 12 hr tablet TAKE 1 TABLET BY MOUTH TWICE DAILY 60 tablet 11  . Canagliflozin-Metformin HCl (INVOKAMET) [phone] MG TABS 1 po bid 90 tablet 3  . cholecalciferol (VITAMIN D) 1000 UNITS tablet Take 1,000 Units by mouth daily.      Marland Kitchen escitalopram (LEXAPRO) 20 MG tablet TAKE 1 TABLET BY MOUTH EVERY DAY 90 tablet 1  . losartan (COZAAR) 100 MG tablet TAKE 1 TABLET (100 MG TOTAL) BY MOUTH DAILY. 30 tablet 11  . saxagliptin HCl (ONGLYZA) 5 MG TABS tablet Take 1 tablet (5 mg total) by mouth daily. 30 tablet 11  . triamcinolone cream (KENALOG) 0.1 % APPLY TOPICALLY 2 (TWO) TIMES DAILY. 45 g 3   No facility-administered medications prior to visit.     ROS Review of Systems  Constitutional: Positive for unexpected weight change. Negative for appetite change and fatigue.  HENT: Negative for congestion, nosebleeds, sneezing, sore throat and trouble swallowing.   Eyes: Negative for itching and visual disturbance.  Respiratory: Negative for cough.   Cardiovascular: Negative for chest pain, palpitations and leg swelling.  Gastrointestinal: Negative for abdominal distention, blood in stool, diarrhea and nausea.  Genitourinary: Negative for frequency and hematuria.  Musculoskeletal: Negative for back pain, gait problem, joint swelling and neck pain.  Skin: Negative for rash.  Neurological: Negative for dizziness, tremors, speech difficulty and weakness.  Psychiatric/Behavioral: Negative for agitation,  dysphoric mood, sleep disturbance and suicidal ideas. The patient is not nervous/anxious.     Objective:  BP 130/84   Pulse 83   Wt 235 lb (106.6 kg)   SpO2 95%   BMI 33.24 kg/m   BP Readings from Last 3 Encounters:  12/28/15 130/84  08/09/15 140/84  04/09/15 130/82    Wt Readings from Last 3 Encounters:  12/28/15 235 lb (106.6 kg)  08/09/15 250 lb (113.4 kg)  04/09/15 243 lb (110.2 kg)    Physical Exam  Constitutional: He is oriented to person, place, and time. He appears well-developed. No distress.  NAD  HENT:  Mouth/Throat: Oropharynx is clear and moist.  Eyes: Conjunctivae are normal. Pupils are equal, round, and reactive to light.  Neck: Normal range of motion. No JVD present. No thyromegaly present.  Cardiovascular: Normal rate, regular rhythm, normal heart sounds and intact distal pulses.  Exam reveals no gallop and no friction rub.   No murmur heard. Pulmonary/Chest: Effort normal and breath sounds normal. No respiratory distress. He has no wheezes. He has no rales. He exhibits no tenderness.  Abdominal: Soft. Bowel sounds are normal. He exhibits no distension and no mass. There is no tenderness. There is no rebound and no guarding.  Musculoskeletal: Normal range of motion. He exhibits no edema or tenderness.  Lymphadenopathy:    He has no cervical adenopathy.  Neurological: He is alert and oriented to person, place, and time. He has normal reflexes. No cranial nerve deficit. He exhibits  normal muscle tone. He displays a negative Romberg sign. Coordination and gait normal.  Skin: Skin is warm and dry. No rash noted.  Psychiatric: He has a normal mood and affect. His behavior is normal. Judgment and thought content normal.    Lab Results  Component Value Date   WBC 6.8 04/26/2011   HGB 14.3 04/26/2011   HCT 41.5 04/26/2011   PLT 286.0 04/26/2011   GLUCOSE 128 (H) 12/09/2015   CHOL 154 12/09/2013   TRIG 245.0 (H) 12/09/2013   HDL 46.60 12/09/2013   LDLDIRECT  76.8 04/26/2011   LDLCALC 58 12/09/2013   ALT 49 12/09/2013   AST 29 12/09/2013   NA 137 12/09/2015   K 4.2 12/09/2015   CL 100 12/09/2015   CREATININE 1.14 12/09/2015   BUN 23 12/09/2015   CO2 24 12/09/2015   TSH 1.20 04/26/2011   PSA 0.73 04/26/2011   INR 1.09 06/16/2010   HGBA1C 6.4 12/09/2015    Dg Pelvis Portable  Result Date: 06/14/2010 Clinical Data: Postoperative for right total hip arthroplasty.  PORTABLE PELVIS  Comparison: 03/15/2005  Findings: The right hip prosthesis is in place with expected alignment and without fracture or complicating feature identified. A small amount of gas is present in the adjacent soft tissues of the upper thigh.  There is spurring of the left acetabulum with mild loss of articular space in the left hip.  IMPRESSION:  1.  Right hip prosthesis in expected position without fracture or complicating feature identified. Provider: Sherene SiresStephanie Clarke, Earlean PolkaWhitney Parks  Dg Hip Portable 1 View Right  Result Date: 06/14/2010 Clinical Data: Postoperative right total hip prosthesis.  PORTABLE RIGHT HIP - 1 VIEW  Comparison: None.  Findings: On cross-table lateral projection, the right hip prosthesis appears unremarkable.  No dislocation, fracture, or complicating feature noted.  IMPRESSION:  1.  Right total hip prosthesis without complicating feature. Provider: Sherene SiresStephanie Clarke, Earlean PolkaWhitney Parks   Assessment & Plan:   There are no diagnoses linked to this encounter. I am having [name] maintain his cholecalciferol, b complex vitamins, aspirin, losartan, triamcinolone cream, Canagliflozin-Metformin HCl, saxagliptin HCl, buPROPion, atorvastatin, and escitalopram.  No orders of the defined types were placed in this encounter.    Follow-up: No Follow-up on file.  Sonda PrimesAlex Plotnikov, MD

## 2016-01-05 ENCOUNTER — Other Ambulatory Visit: Payer: Self-pay | Admitting: Internal Medicine

## 2016-01-31 ENCOUNTER — Encounter: Payer: Self-pay | Admitting: Internal Medicine

## 2016-01-31 ENCOUNTER — Encounter: Payer: Self-pay | Admitting: Gastroenterology

## 2016-02-01 DIAGNOSIS — E119 Type 2 diabetes mellitus without complications: Secondary | ICD-10-CM | POA: Diagnosis not present

## 2016-02-01 DIAGNOSIS — H2513 Age-related nuclear cataract, bilateral: Secondary | ICD-10-CM | POA: Diagnosis not present

## 2016-02-16 ENCOUNTER — Encounter: Payer: Self-pay | Admitting: Internal Medicine

## 2016-02-17 ENCOUNTER — Other Ambulatory Visit: Payer: Self-pay | Admitting: *Deleted

## 2016-02-17 MED ORDER — CANAGLIFLOZIN-METFORMIN HCL 150-1000 MG PO TABS: 1.0000 | ORAL_TABLET | Freq: Two times a day (BID) | ORAL | 1 refills | Status: DC

## 2016-05-31 ENCOUNTER — Other Ambulatory Visit: Payer: Self-pay | Admitting: Internal Medicine

## 2016-06-02 ENCOUNTER — Other Ambulatory Visit: Payer: Self-pay | Admitting: *Deleted

## 2016-06-02 MED ORDER — ATORVASTATIN CALCIUM 40 MG PO TABS: 40.0000 mg | ORAL_TABLET | Freq: Every day | ORAL | 2 refills | Status: DC

## 2016-07-11 ENCOUNTER — Telehealth: Payer: Self-pay

## 2016-07-11 MED ORDER — SAXAGLIPTIN HCL 5 MG PO TABS: 5.0000 mg | ORAL_TABLET | Freq: Every day | ORAL | 0 refills | Status: DC

## 2016-07-11 NOTE — Telephone Encounter (Signed)
Sending in Onglyza to [facility] with #0 refills Patient needs to schedule an OV for further refills.

## 2016-07-31 ENCOUNTER — Other Ambulatory Visit (INDEPENDENT_AMBULATORY_CARE_PROVIDER_SITE_OTHER): Payer: BLUE CROSS/BLUE SHIELD

## 2016-07-31 DIAGNOSIS — I10 Essential (primary) hypertension: Secondary | ICD-10-CM

## 2016-07-31 DIAGNOSIS — E119 Type 2 diabetes mellitus without complications: Secondary | ICD-10-CM | POA: Diagnosis not present

## 2016-07-31 LAB — BASIC METABOLIC PANEL
BUN: 18 mg/dL (ref 6–23)
CHLORIDE: 102 meq/L (ref 96–112)
CO2: 26 mEq/L (ref 19–32)
Calcium: 9.9 mg/dL (ref 8.4–10.5)
Creatinine, Ser: 0.88 mg/dL (ref 0.40–1.50)
GFR: 93.33 mL/min (ref >60.00)
GLUCOSE: 106 mg/dL — AB (ref 70–99)
Potassium: 4 mEq/L (ref 3.5–5.1)
SODIUM: 138 meq/L (ref 135–145)

## 2016-07-31 LAB — HEMOGLOBIN A1C: Hgb A1c MFr Bld: 6.3 % (ref 4.6–6.5)

## 2016-08-02 ENCOUNTER — Ambulatory Visit (INDEPENDENT_AMBULATORY_CARE_PROVIDER_SITE_OTHER): Payer: BLUE CROSS/BLUE SHIELD | Admitting: Internal Medicine

## 2016-08-02 ENCOUNTER — Encounter: Payer: Self-pay | Admitting: Internal Medicine

## 2016-08-02 ENCOUNTER — Other Ambulatory Visit (INDEPENDENT_AMBULATORY_CARE_PROVIDER_SITE_OTHER): Payer: BLUE CROSS/BLUE SHIELD

## 2016-08-02 VITALS — BP 142/88 | HR 77 | Temp 98.5°F | Resp 16 | Ht 69.0 in | Wt 239.8 lb

## 2016-08-02 DIAGNOSIS — E119 Type 2 diabetes mellitus without complications: Secondary | ICD-10-CM | POA: Diagnosis not present

## 2016-08-02 DIAGNOSIS — F329 Major depressive disorder, single episode, unspecified: Secondary | ICD-10-CM

## 2016-08-02 DIAGNOSIS — Z0000 Encounter for general adult medical examination without abnormal findings: Secondary | ICD-10-CM

## 2016-08-02 DIAGNOSIS — E785 Hyperlipidemia, unspecified: Secondary | ICD-10-CM

## 2016-08-02 DIAGNOSIS — F32A Depression, unspecified: Secondary | ICD-10-CM

## 2016-08-02 LAB — URINALYSIS
BILIRUBIN URINE: NEGATIVE
HGB URINE DIPSTICK: NEGATIVE
Ketones, ur: NEGATIVE
Leukocytes, UA: NEGATIVE
NITRITE: NEGATIVE
PH: 5.5 (ref 5.0–8.0)
Specific Gravity, Urine: <=1.005 — AB (ref 1.000–1.030)
TOTAL PROTEIN, URINE-UPE24: NEGATIVE
Urobilinogen, UA: 0.2 (ref 0.0–1.0)

## 2016-08-02 MED ORDER — ESCITALOPRAM OXALATE 10 MG PO TABS: 10.0000 mg | ORAL_TABLET | Freq: Every day | ORAL | 2 refills | Status: DC

## 2016-08-02 NOTE — Assessment & Plan Note (Signed)
Onglyza [facility]

## 2016-08-02 NOTE — Assessment & Plan Note (Signed)
LIPITOR 

## 2016-08-02 NOTE — Progress Notes (Signed)
Subjective:  Patient ID: Roberto Swanson, male    DOB: February 14, 1956  Age: [age] y.o. MRN: [id number]  CC: Annual Exam (Type 2 DM, HTN, arthritis, Colonoscopy reccomendation )   HPI    Roberto Swanson presents for depression, HTN, DM f/u, Father was dx'd w/colon ca at 3088. Cologard (-) 2 years ago... Well exam  Outpatient Medications Prior to Visit  Medication Sig Dispense Refill  . aspirin 325 MG EC tablet Take 325 mg by mouth daily.    Marland Kitchen. atorvastatin (LIPITOR) 40 MG tablet Take 1 tablet (40 mg total) by mouth daily. 90 tablet 2  . b complex vitamins tablet Take 1 tablet by mouth daily.      Marland Kitchen. buPROPion (WELLBUTRIN SR) 150 MG 12 hr tablet TAKE 1 TABLET BY MOUTH TWICE DAILY 60 tablet 11  . Canagliflozin-Metformin HCl (INVOKAMET) [phone] MG TABS Take 1 tablet by mouth 2 (two) times daily. 180 tablet 1  . cholecalciferol (VITAMIN D) 1000 UNITS tablet Take 1,000 Units by mouth daily.      Marland Kitchen. losartan (COZAAR) 100 MG tablet TAKE 1 TABLET BY MOUTH EVERY DAY 30 tablet 11  . saxagliptin HCl (ONGLYZA) 5 MG TABS tablet Take 1 tablet (5 mg total) by mouth daily. 30 tablet 0  . triamcinolone cream (KENALOG) 0.1 % APPLY TOPICALLY 2 (TWO) TIMES DAILY. 45 g 3  . escitalopram (LEXAPRO) 20 MG tablet Take 1 tablet (20 mg total) by mouth daily. Yearly physical w/labs due in March must see MD for refills 90 tablet 0   No facility-administered medications prior to visit.     ROS Review of Systems  Constitutional: Negative for appetite change, fatigue and unexpected weight change.  HENT: Negative for congestion, nosebleeds, sneezing, sore throat and trouble swallowing.   Eyes: Negative for itching and visual disturbance.  Respiratory: Negative for cough.   Cardiovascular: Negative for chest pain, palpitations and leg swelling.  Gastrointestinal: Negative for abdominal distention, blood in stool, diarrhea and nausea.  Genitourinary: Negative for frequency and hematuria.  Musculoskeletal: Negative for  back pain, gait problem, joint swelling and neck pain.  Skin: Negative for rash.  Neurological: Negative for dizziness, tremors, speech difficulty and weakness.  Psychiatric/Behavioral: Negative for agitation, dysphoric mood, sleep disturbance and suicidal ideas. The patient is not nervous/anxious.     Objective:  BP (!) 142/88   Pulse 77   Temp 98.5 F (36.9 C) (Oral)   Resp 16   Ht 5\' 9"  (1.753 m)   Wt 239 lb 12 oz (108.7 kg)   SpO2 97%   BMI 35.40 kg/m   BP Readings from Last 3 Encounters:  08/02/16 (!) 142/88  12/28/15 130/84  08/09/15 140/84    Wt Readings from Last 3 Encounters:  08/02/16 239 lb 12 oz (108.7 kg)  12/28/15 235 lb (106.6 kg)  08/09/15 250 lb (113.4 kg)    Physical Exam  Constitutional: He is oriented to person, place, and time. He appears well-developed. No distress.  NAD  HENT:  Mouth/Throat: Oropharynx is clear and moist.  Eyes: Conjunctivae are normal. Pupils are equal, round, and reactive to light.  Neck: Normal range of motion. No JVD present. No thyromegaly present.  Cardiovascular: Normal rate, regular rhythm, normal heart sounds and intact distal pulses.  Exam reveals no gallop and no friction rub.   No murmur heard. Pulmonary/Chest: Effort normal and breath sounds normal. No respiratory distress. He has no wheezes. He has no rales. He exhibits no tenderness.  Abdominal: Soft. Bowel sounds  are normal. He exhibits no distension and no mass. There is no tenderness. There is no rebound and no guarding.  Genitourinary: Rectum normal and prostate normal. Rectal exam shows guaiac negative stool.  Musculoskeletal: Normal range of motion. He exhibits no edema or tenderness.  Lymphadenopathy:    He has no cervical adenopathy.  Neurological: He is alert and oriented to person, place, and time. He has normal reflexes. No cranial nerve deficit. He exhibits normal muscle tone. He displays a negative Romberg sign. Coordination and gait normal.  Skin: Skin  is warm and dry. No rash noted.  Psychiatric: He has a normal mood and affect. His behavior is normal. Judgment and thought content normal.    Lab Results  Component Value Date   WBC 6.8 04/26/2011   HGB 14.3 04/26/2011   HCT 41.5 04/26/2011   PLT 286.0 04/26/2011   GLUCOSE 106 (H) 07/31/2016   CHOL 154 12/09/2013   TRIG 245.0 (H) 12/09/2013   HDL 46.60 12/09/2013   LDLDIRECT 76.8 04/26/2011   LDLCALC 58 12/09/2013   ALT 49 12/09/2013   AST 29 12/09/2013   NA 138 07/31/2016   K 4.0 07/31/2016   CL 102 07/31/2016   CREATININE 0.88 07/31/2016   BUN 18 07/31/2016   CO2 26 07/31/2016   TSH 1.20 04/26/2011   PSA 0.73 04/26/2011   INR 1.09 06/16/2010   HGBA1C 6.3 07/31/2016    Dg Pelvis Portable  Result Date: 06/14/2010 Clinical Data: Postoperative for right total hip arthroplasty.  PORTABLE PELVIS  Comparison: 03/15/2005  Findings: The right hip prosthesis is in place with expected alignment and without fracture or complicating feature identified. A small amount of gas is present in the adjacent soft tissues of the upper thigh.  There is spurring of the left acetabulum with mild loss of articular space in the left hip.  IMPRESSION:  1.  Right hip prosthesis in expected position without fracture or complicating feature identified. Provider: Sherene Sires, Earlean Polka  Dg Hip Portable 1 View Right  Result Date: 06/14/2010 Clinical Data: Postoperative right total hip prosthesis.  PORTABLE RIGHT HIP - 1 VIEW  Comparison: None.  Findings: On cross-table lateral projection, the right hip prosthesis appears unremarkable.  No dislocation, fracture, or complicating feature noted.  IMPRESSION:  1.  Right total hip prosthesis without complicating feature. Provider: Sherene Sires, Earlean Polka   Assessment & Plan:   There are no diagnoses linked to this encounter. I am having [name] maintain his cholecalciferol, b complex vitamins, aspirin, triamcinolone cream, buPROPion,  losartan, Canagliflozin-Metformin HCl, atorvastatin, saxagliptin HCl, and escitalopram.  Meds ordered this encounter  Medications  . escitalopram (LEXAPRO) 10 MG tablet    Sig: Take 10 mg by mouth daily.     Follow-up: No Follow-up on file.  Sonda Primes, MD

## 2016-08-02 NOTE — Progress Notes (Signed)
Pre-visit discussion using our clinic review tool. No additional management support is needed unless otherwise documented below in the visit note.  

## 2016-08-02 NOTE — Assessment & Plan Note (Signed)
Lexapro reduced - doing well

## 2016-08-07 ENCOUNTER — Other Ambulatory Visit: Payer: Self-pay | Admitting: *Deleted

## 2016-08-07 MED ORDER — SAXAGLIPTIN HCL 5 MG PO TABS: 5.0000 mg | ORAL_TABLET | Freq: Every day | ORAL | 3 refills | Status: DC

## 2016-08-07 MED ORDER — CANAGLIFLOZIN-METFORMIN HCL 150-1000 MG PO TABS: 1.0000 | ORAL_TABLET | Freq: Two times a day (BID) | ORAL | 3 refills | Status: DC

## 2016-08-30 ENCOUNTER — Other Ambulatory Visit: Payer: Self-pay | Admitting: Internal Medicine

## 2016-12-04 ENCOUNTER — Ambulatory Visit (INDEPENDENT_AMBULATORY_CARE_PROVIDER_SITE_OTHER): Payer: BLUE CROSS/BLUE SHIELD | Admitting: Internal Medicine

## 2016-12-04 ENCOUNTER — Encounter: Payer: Self-pay | Admitting: Internal Medicine

## 2016-12-04 DIAGNOSIS — I10 Essential (primary) hypertension: Secondary | ICD-10-CM | POA: Diagnosis not present

## 2016-12-04 DIAGNOSIS — E119 Type 2 diabetes mellitus without complications: Secondary | ICD-10-CM | POA: Diagnosis not present

## 2016-12-04 DIAGNOSIS — J069 Acute upper respiratory infection, unspecified: Secondary | ICD-10-CM | POA: Diagnosis not present

## 2016-12-04 NOTE — Progress Notes (Signed)
Pre visit review using our clinic review tool, if applicable. No additional management support is needed unless otherwise documented below in the visit note. 

## 2016-12-04 NOTE — Assessment & Plan Note (Signed)
See Rx 

## 2016-12-04 NOTE — Assessment & Plan Note (Signed)
Losartan 

## 2016-12-04 NOTE — Patient Instructions (Signed)
You can use over-the-counter  "cold" medicines  such as "Tylenol cold" , "Advil cold",  "Mucinex" or" Mucinex D"  for cough and congestion.   Avoid decongestants if you have high blood pressure and use "Afrin" nasal spray for nasal congestion as directed. Use " Delsym" or" Robitussin" cough syrup varietis for cough.  You can use plain "Tylenol" or "Advil" for fever, chills and achyness. Use Halls or Ricola cough drops.   "Common cold" symptoms are usually triggered by a virus.  The antibiotics are usually not necessary. On average, a" viral cold" illness would take 4-7 days to resolve.   Please, make an appointment if you are not better or if you're worse.  

## 2016-12-04 NOTE — Assessment & Plan Note (Signed)
Labs Onglyza Invokamet

## 2016-12-05 ENCOUNTER — Other Ambulatory Visit: Payer: Self-pay | Admitting: Internal Medicine

## 2017-02-20 ENCOUNTER — Other Ambulatory Visit: Payer: Self-pay | Admitting: Internal Medicine

## 2017-02-23 ENCOUNTER — Other Ambulatory Visit: Payer: Self-pay

## 2017-02-23 MED ORDER — ATORVASTATIN CALCIUM 40 MG PO TABS: 40.0000 mg | ORAL_TABLET | Freq: Every day | ORAL | 0 refills | Status: DC

## 2017-02-28 DIAGNOSIS — E119 Type 2 diabetes mellitus without complications: Secondary | ICD-10-CM | POA: Diagnosis not present

## 2017-02-28 DIAGNOSIS — H2513 Age-related nuclear cataract, bilateral: Secondary | ICD-10-CM | POA: Diagnosis not present

## 2017-02-28 LAB — HM DIABETES EYE EXAM

## 2017-04-05 ENCOUNTER — Other Ambulatory Visit (INDEPENDENT_AMBULATORY_CARE_PROVIDER_SITE_OTHER): Payer: BLUE CROSS/BLUE SHIELD

## 2017-04-05 ENCOUNTER — Ambulatory Visit: Payer: BLUE CROSS/BLUE SHIELD | Admitting: Internal Medicine

## 2017-04-05 ENCOUNTER — Encounter: Payer: Self-pay | Admitting: Internal Medicine

## 2017-04-05 DIAGNOSIS — E119 Type 2 diabetes mellitus without complications: Secondary | ICD-10-CM | POA: Diagnosis not present

## 2017-04-05 DIAGNOSIS — I10 Essential (primary) hypertension: Secondary | ICD-10-CM

## 2017-04-05 DIAGNOSIS — Z0000 Encounter for general adult medical examination without abnormal findings: Secondary | ICD-10-CM | POA: Diagnosis not present

## 2017-04-05 DIAGNOSIS — Z6836 Body mass index (BMI) 36.0-36.9, adult: Secondary | ICD-10-CM

## 2017-04-05 DIAGNOSIS — E785 Hyperlipidemia, unspecified: Secondary | ICD-10-CM

## 2017-04-05 LAB — CBC WITH DIFFERENTIAL/PLATELET
BASOS ABS: 0.1 10*3/uL (ref 0.0–0.1)
Basophils Relative: 1.8 % (ref 0.0–3.0)
EOS ABS: 0.2 10*3/uL (ref 0.0–0.7)
Eosinophils Relative: 2.7 % (ref 0.0–5.0)
HEMATOCRIT: 44 % (ref 39.0–52.0)
Hemoglobin: 14.7 g/dL (ref 13.0–17.0)
LYMPHS ABS: 1.8 10*3/uL (ref 0.7–4.0)
Lymphocytes Relative: 23.4 % (ref 12.0–46.0)
MCHC: 33.4 g/dL (ref 30.0–36.0)
MCV: 87.6 fl (ref 78.0–100.0)
MONO ABS: 0.8 10*3/uL (ref 0.1–1.0)
Monocytes Relative: 10.2 % (ref 3.0–12.0)
NEUTROS ABS: 4.8 10*3/uL (ref 1.4–7.7)
Neutrophils Relative %: 61.9 % (ref 43.0–77.0)
PLATELETS: 292 10*3/uL (ref 150.0–400.0)
RBC: 5.02 Mil/uL (ref 4.22–5.81)
RDW: 13.8 % (ref 11.5–15.5)
WBC: 7.8 10*3/uL (ref 4.0–10.5)

## 2017-04-05 LAB — LIPID PANEL
CHOL/HDL RATIO: 3
CHOLESTEROL: 127 mg/dL (ref 0–200)
HDL: 39.6 mg/dL (ref >39.00)
NonHDL: 87.36
Triglycerides: 260 mg/dL — ABNORMAL HIGH (ref 0.0–149.0)
VLDL: 52 mg/dL — ABNORMAL HIGH (ref 0.0–40.0)

## 2017-04-05 LAB — PSA: PSA: 1.03 ng/mL (ref 0.10–4.00)

## 2017-04-05 LAB — HEMOGLOBIN A1C: Hgb A1c MFr Bld: 6.5 % (ref 4.6–6.5)

## 2017-04-05 LAB — HEPATIC FUNCTION PANEL
ALK PHOS: 83 U/L (ref 39–117)
ALT: 31 U/L (ref 0–53)
AST: 19 U/L (ref 0–37)
Albumin: 4.4 g/dL (ref 3.5–5.2)
BILIRUBIN DIRECT: 0.1 mg/dL (ref 0.0–0.3)
TOTAL PROTEIN: 7.3 g/dL (ref 6.0–8.3)
Total Bilirubin: 0.5 mg/dL (ref 0.2–1.2)

## 2017-04-05 LAB — BASIC METABOLIC PANEL
BUN: 17 mg/dL (ref 6–23)
CALCIUM: 9.3 mg/dL (ref 8.4–10.5)
CO2: 24 mEq/L (ref 19–32)
Chloride: 101 mEq/L (ref 96–112)
Creatinine, Ser: 0.77 mg/dL (ref 0.40–1.50)
GFR: 108.64 mL/min (ref >60.00)
GLUCOSE: 125 mg/dL — AB (ref 70–99)
POTASSIUM: 4.3 meq/L (ref 3.5–5.1)
SODIUM: 137 meq/L (ref 135–145)

## 2017-04-05 LAB — MICROALBUMIN / CREATININE URINE RATIO
CREATININE, U: 89.5 mg/dL
MICROALB/CREAT RATIO: 1.4 mg/g (ref 0.0–30.0)
Microalb, Ur: 1.3 mg/dL (ref 0.0–1.9)

## 2017-04-05 LAB — TSH: TSH: 1.08 u[IU]/mL (ref 0.35–4.50)

## 2017-04-05 LAB — LDL CHOLESTEROL, DIRECT: Direct LDL: 59 mg/dL

## 2017-04-05 MED ORDER — DAPAGLIFLOZIN-SAXAGLIPTIN 10-5 MG PO TABS: 1.0000 | ORAL_TABLET | Freq: Every day | ORAL | 11 refills | Status: DC

## 2017-04-05 MED ORDER — METFORMIN HCL 1000 MG PO TABS: 1000.0000 mg | ORAL_TABLET | Freq: Two times a day (BID) | ORAL | 3 refills | Status: DC

## 2017-04-05 NOTE — Assessment & Plan Note (Signed)
Wt Readings from Last 3 Encounters:  04/05/17 246 lb (111.6 kg)  12/04/16 242 lb (109.8 kg)  08/02/16 239 lb 12 oz (108.7 kg)

## 2017-04-05 NOTE — Progress Notes (Signed)
Subjective:  Patient ID: Roberto Swanson, male    DOB: 07-20-1955  Age: 61 y.o. MRN: [id number]  CC: No chief complaint on file.   HPI Roberto Halstednthony D Runkel presents for DM, depression, dyslipidemia f/u  Outpatient Medications Prior to Visit  Medication Sig Dispense Refill  . aspirin 325 MG EC tablet Take 325 mg by mouth daily.    Marland Kitchen. atorvastatin (LIPITOR) 40 MG tablet Take 1 tablet (40 mg total) by mouth daily. 90 tablet 0  . b complex vitamins tablet Take 1 tablet by mouth daily.      Marland Kitchen. buPROPion (WELLBUTRIN SR) 150 MG 12 hr tablet TAKE 1 TABLET BY MOUTH TWICE DAILY 180 tablet 3  . Canagliflozin-Metformin HCl (INVOKAMET) [phone] MG TABS Take 1 tablet by mouth 2 (two) times daily. 180 tablet 3  . cholecalciferol (VITAMIN D) 1000 UNITS tablet Take 1,000 Units by mouth daily.      Marland Kitchen. escitalopram (LEXAPRO) 10 MG tablet Take 1 tablet (10 mg total) by mouth daily. 90 tablet 2  . losartan (COZAAR) 100 MG tablet TAKE 1 TABLET BY MOUTH EVERY DAY 90 tablet 4  . saxagliptin HCl (ONGLYZA) 5 MG TABS tablet Take 1 tablet (5 mg total) by mouth daily. 90 tablet 3  . triamcinolone cream (KENALOG) 0.1 % APPLY TOPICALLY 2 (TWO) TIMES DAILY. 45 g 3   No facility-administered medications prior to visit.     ROS Review of Systems  Constitutional: Negative for appetite change, fatigue and unexpected weight change.  HENT: Positive for congestion. Negative for nosebleeds, sneezing, sore throat and trouble swallowing.   Eyes: Negative for itching and visual disturbance.  Respiratory: Negative for cough.   Cardiovascular: Negative for chest pain, palpitations and leg swelling.  Gastrointestinal: Negative for abdominal distention, blood in stool, diarrhea and nausea.  Genitourinary: Negative for frequency and hematuria.  Musculoskeletal: Negative for back pain, gait problem, joint swelling and neck pain.  Skin: Negative for rash.  Neurological: Negative for dizziness, tremors, speech difficulty and  weakness.  Psychiatric/Behavioral: Negative for agitation, dysphoric mood and sleep disturbance. The patient is not nervous/anxious.     Objective:  BP 124/78 (BP Location: Left Arm, Patient Position: Sitting, Cuff Size: Large)   Pulse 66   Temp 98.4 F (36.9 C) (Oral)   Ht 5\' 9"  (1.753 m)   Wt 246 lb (111.6 kg)   SpO2 98%   BMI 36.33 kg/m   BP Readings from Last 3 Encounters:  04/05/17 124/78  12/04/16 128/82  08/02/16 (!) 142/88    Wt Readings from Last 3 Encounters:  04/05/17 246 lb (111.6 kg)  12/04/16 242 lb (109.8 kg)  08/02/16 239 lb 12 oz (108.7 kg)    Physical Exam  Constitutional: He is oriented to person, place, and time. He appears well-developed. No distress.  NAD  HENT:  Mouth/Throat: Oropharynx is clear and moist.  Eyes: Conjunctivae are normal. Pupils are equal, round, and reactive to light.  Neck: Normal range of motion. No JVD present. No thyromegaly present.  Cardiovascular: Normal rate, regular rhythm, normal heart sounds and intact distal pulses. Exam reveals no gallop and no friction rub.  No murmur heard. Pulmonary/Chest: Effort normal and breath sounds normal. No respiratory distress. He has no wheezes. He has no rales. He exhibits no tenderness.  Abdominal: Soft. Bowel sounds are normal. He exhibits no distension and no mass. There is no tenderness. There is no rebound and no guarding.  Musculoskeletal: Normal range of motion. He exhibits no edema or tenderness.  Lymphadenopathy:    He has no cervical adenopathy.  Neurological: He is alert and oriented to person, place, and time. He has normal reflexes. No cranial nerve deficit. He exhibits normal muscle tone. He displays a negative Romberg sign. Coordination and gait normal.  Skin: Skin is warm and dry. No rash noted.  Psychiatric: He has a normal mood and affect. His behavior is normal. Judgment and thought content normal.    Lab Results  Component Value Date   WBC 6.8 04/26/2011   HGB 14.3  04/26/2011   HCT 41.5 04/26/2011   PLT 286.0 04/26/2011   GLUCOSE 106 (H) 07/31/2016   CHOL 154 12/09/2013   TRIG 245.0 (H) 12/09/2013   HDL 46.60 12/09/2013   LDLDIRECT 76.8 04/26/2011   LDLCALC 58 12/09/2013   ALT 49 12/09/2013   AST 29 12/09/2013   NA 138 07/31/2016   K 4.0 07/31/2016   CL 102 07/31/2016   CREATININE 0.88 07/31/2016   BUN 18 07/31/2016   CO2 26 07/31/2016   TSH 1.20 04/26/2011   PSA 0.73 04/26/2011   INR 1.09 06/16/2010   HGBA1C 6.3 07/31/2016    Dg Pelvis Portable  Result Date: 06/14/2010 Clinical Data: Postoperative for right total hip arthroplasty.  PORTABLE PELVIS  Comparison: 03/15/2005  Findings: The right hip prosthesis is in place with expected alignment and without fracture or complicating feature identified. A small amount of gas is present in the adjacent soft tissues of the upper thigh.  There is spurring of the left acetabulum with mild loss of articular space in the left hip.  IMPRESSION:  1.  Right hip prosthesis in expected position without fracture or complicating feature identified. Provider: Sherene SiresStephanie Clarke, Earlean PolkaWhitney Parks  Dg Hip Portable 1 View Right  Result Date: 06/14/2010 Clinical Data: Postoperative right total hip prosthesis.  PORTABLE RIGHT HIP - 1 VIEW  Comparison: None.  Findings: On cross-table lateral projection, the right hip prosthesis appears unremarkable.  No dislocation, fracture, or complicating feature noted.  IMPRESSION:  1.  Right total hip prosthesis without complicating feature. Provider: Sherene SiresStephanie Clarke, Earlean PolkaWhitney Parks   Assessment & Plan:   There are no diagnoses linked to this encounter. I am having Roberto HalstedAnthony D. Battle maintain his cholecalciferol, b complex vitamins, aspirin, triamcinolone cream, escitalopram, saxagliptin HCl, Canagliflozin-Metformin HCl, buPROPion, losartan, and atorvastatin.  No orders of the defined types were placed in this encounter.    Follow-up: No Follow-up on file.  Sonda PrimesAlex Omario Ander,  MD

## 2017-04-05 NOTE — Patient Instructions (Signed)
Take Qtern and Metformin in place of Invokamet and Onglyza

## 2017-04-05 NOTE — Assessment & Plan Note (Signed)
Losartan 

## 2017-04-05 NOTE — Assessment & Plan Note (Signed)
Lipitor 

## 2017-04-05 NOTE — Assessment & Plan Note (Addendum)
Onglyza, Invokamet Take Qtern and Metformin in place of Invokamet and Onglyza due to co pay

## 2017-06-28 ENCOUNTER — Telehealth: Payer: Self-pay | Admitting: Internal Medicine

## 2017-06-28 MED ORDER — ATORVASTATIN CALCIUM 40 MG PO TABS: 40.0000 mg | ORAL_TABLET | Freq: Every day | ORAL | 1 refills | Status: DC

## 2017-06-28 MED ORDER — ESCITALOPRAM OXALATE 10 MG PO TABS: 10.0000 mg | ORAL_TABLET | Freq: Every day | ORAL | 2 refills | Status: DC

## 2017-06-28 NOTE — Telephone Encounter (Signed)
Copied from CRM [id number]. Topic: Quick Communication - See Telephone Encounter >> Jun 28, 2017  4:10 PM Terisa Starraylor, Brittany L wrote: CRM for notification. See Telephone encounter for:   06/28/17.  [facility] is requesting atorvastatin (LIPITOR) 40 MG tablet & escitalopram (LEXAPRO) 10 MG tablet

## 2017-07-16 ENCOUNTER — Other Ambulatory Visit: Payer: Self-pay | Admitting: Internal Medicine

## 2017-08-03 ENCOUNTER — Ambulatory Visit: Payer: BLUE CROSS/BLUE SHIELD | Admitting: Internal Medicine

## 2017-08-07 ENCOUNTER — Ambulatory Visit: Payer: Self-pay | Admitting: Internal Medicine

## 2017-08-17 ENCOUNTER — Other Ambulatory Visit (INDEPENDENT_AMBULATORY_CARE_PROVIDER_SITE_OTHER): Payer: BLUE CROSS/BLUE SHIELD

## 2017-08-17 ENCOUNTER — Ambulatory Visit: Payer: BLUE CROSS/BLUE SHIELD | Admitting: Internal Medicine

## 2017-08-17 ENCOUNTER — Encounter: Payer: Self-pay | Admitting: Internal Medicine

## 2017-08-17 DIAGNOSIS — Z6836 Body mass index (BMI) 36.0-36.9, adult: Secondary | ICD-10-CM | POA: Diagnosis not present

## 2017-08-17 DIAGNOSIS — E785 Hyperlipidemia, unspecified: Secondary | ICD-10-CM

## 2017-08-17 DIAGNOSIS — M79671 Pain in right foot: Secondary | ICD-10-CM

## 2017-08-17 DIAGNOSIS — I10 Essential (primary) hypertension: Secondary | ICD-10-CM | POA: Diagnosis not present

## 2017-08-17 DIAGNOSIS — F32A Depression, unspecified: Secondary | ICD-10-CM

## 2017-08-17 DIAGNOSIS — E119 Type 2 diabetes mellitus without complications: Secondary | ICD-10-CM

## 2017-08-17 DIAGNOSIS — F329 Major depressive disorder, single episode, unspecified: Secondary | ICD-10-CM | POA: Diagnosis not present

## 2017-08-17 LAB — HEMOGLOBIN A1C: Hgb A1c MFr Bld: 6.7 % — ABNORMAL HIGH (ref 4.6–6.5)

## 2017-08-17 LAB — BASIC METABOLIC PANEL
BUN: 16 mg/dL (ref 6–23)
CALCIUM: 9.7 mg/dL (ref 8.4–10.5)
CHLORIDE: 101 meq/L (ref 96–112)
CO2: 26 meq/L (ref 19–32)
CREATININE: 0.91 mg/dL (ref 0.40–1.50)
GFR: 89.48 mL/min (ref >60.00)
Glucose, Bld: 127 mg/dL — ABNORMAL HIGH (ref 70–99)
Potassium: 4.3 mEq/L (ref 3.5–5.1)
Sodium: 138 mEq/L (ref 135–145)

## 2017-08-17 NOTE — Progress Notes (Signed)
Subjective:  Patient ID: Roberto Swanson, male    DOB: 11/04/1955  Age: 62 y.o. MRN: [id number]  CC: No chief complaint on file.   HPI Roberto Swanson presents for HTN, DM, depression f/u  C/o R foot pain - 1st MTP x long time  Outpatient Medications Prior to Visit  Medication Sig Dispense Refill  . aspirin 325 MG EC tablet Take 325 mg by mouth daily.    Marland Kitchen. atorvastatin (LIPITOR) 40 MG tablet Take 1 tablet (40 mg total) by mouth daily. 90 tablet 1  . b complex vitamins tablet Take 1 tablet by mouth daily.      Marland Kitchen. buPROPion (WELLBUTRIN SR) 150 MG 12 hr tablet TAKE 1 TABLET BY MOUTH TWICE DAILY 180 tablet 3  . cholecalciferol (VITAMIN D) 1000 UNITS tablet Take 1,000 Units by mouth daily.      . Dapagliflozin-Saxagliptin (QTERN) 10-5 MG TABS Take 1 tablet daily by mouth. 30 tablet 11  . escitalopram (LEXAPRO) 10 MG tablet Take 1 tablet (10 mg total) by mouth daily. 90 tablet 2  . losartan (COZAAR) 100 MG tablet TAKE 1 TABLET BY MOUTH EVERY DAY 90 tablet 1  . metFORMIN (GLUCOPHAGE) 1000 MG tablet Take 1 tablet (1,000 mg total) 2 (two) times daily with a meal by mouth. 180 tablet 3  . triamcinolone cream (KENALOG) 0.1 % APPLY TOPICALLY 2 (TWO) TIMES DAILY. 45 g 3  . Canagliflozin-Metformin HCl (INVOKAMET) [phone] MG TABS Take 1 tablet by mouth 2 (two) times daily. 180 tablet 3  . saxagliptin HCl (ONGLYZA) 5 MG TABS tablet Take 1 tablet (5 mg total) by mouth daily. 90 tablet 3   No facility-administered medications prior to visit.     ROS Review of Systems  Constitutional: Negative for appetite change, fatigue and unexpected weight change.  HENT: Negative for congestion, nosebleeds, sneezing, sore throat and trouble swallowing.   Eyes: Negative for itching and visual disturbance.  Respiratory: Negative for cough.   Cardiovascular: Negative for chest pain, palpitations and leg swelling.  Gastrointestinal: Negative for abdominal distention, blood in stool, diarrhea and nausea.    Genitourinary: Negative for frequency and hematuria.  Musculoskeletal: Positive for arthralgias. Negative for back pain, gait problem, joint swelling and neck pain.  Skin: Negative for rash.  Neurological: Negative for dizziness, tremors, speech difficulty and weakness.  Psychiatric/Behavioral: Negative for agitation, dysphoric mood and sleep disturbance. The patient is not nervous/anxious.   R 1st MTP w/reduced ROM, pain  Objective:  BP 128/82 (BP Location: Left Arm, Patient Position: Sitting, Cuff Size: Large)   Pulse 65   Temp 98.6 F (37 C) (Oral)   Ht 5\' 9"  (1.753 m)   Wt 244 lb (110.7 kg)   SpO2 98%   BMI 36.03 kg/m   BP Readings from Last 3 Encounters:  08/17/17 128/82  04/05/17 124/78  12/04/16 128/82    Wt Readings from Last 3 Encounters:  08/17/17 244 lb (110.7 kg)  04/05/17 246 lb (111.6 kg)  12/04/16 242 lb (109.8 kg)    Physical Exam  Constitutional: He is oriented to person, place, and time. He appears well-developed. No distress.  NAD  HENT:  Mouth/Throat: Oropharynx is clear and moist.  Eyes: Pupils are equal, round, and reactive to light. Conjunctivae are normal.  Neck: Normal range of motion. No JVD present. No thyromegaly present.  Cardiovascular: Normal rate, regular rhythm, normal heart sounds and intact distal pulses. Exam reveals no gallop and no friction rub.  No murmur heard. Pulmonary/Chest: Effort normal  and breath sounds normal. No respiratory distress. He has no wheezes. He has no rales. He exhibits no tenderness.  Abdominal: Soft. Bowel sounds are normal. He exhibits no distension and no mass. There is no tenderness. There is no rebound and no guarding.  Musculoskeletal: Normal range of motion. He exhibits no edema or tenderness.  Lymphadenopathy:    He has no cervical adenopathy.  Neurological: He is alert and oriented to person, place, and time. He has normal reflexes. No cranial nerve deficit. He exhibits normal muscle tone. He displays a  negative Romberg sign. Coordination and gait normal.  Skin: Skin is warm and dry. No rash noted.  Psychiatric: He has a normal mood and affect. His behavior is normal. Judgment and thought content normal.  1st MTP w/decreased ROM and pain  Lab Results  Component Value Date   WBC 7.8 04/05/2017   HGB 14.7 04/05/2017   HCT 44.0 04/05/2017   PLT 292.0 04/05/2017   GLUCOSE 125 (H) 04/05/2017   CHOL 127 04/05/2017   TRIG 260.0 (H) 04/05/2017   HDL 39.60 04/05/2017   LDLDIRECT 59.0 04/05/2017   LDLCALC 58 12/09/2013   ALT 31 04/05/2017   AST 19 04/05/2017   NA 137 04/05/2017   K 4.3 04/05/2017   CL 101 04/05/2017   CREATININE 0.77 04/05/2017   BUN 17 04/05/2017   CO2 24 04/05/2017   TSH 1.08 04/05/2017   PSA 1.03 04/05/2017   INR 1.09 06/16/2010   HGBA1C 6.5 04/05/2017   MICROALBUR 1.3 04/05/2017    Dg Pelvis Portable  Result Date: 06/14/2010 Clinical Data: Postoperative for right total hip arthroplasty.  PORTABLE PELVIS  Comparison: 03/15/2005  Findings: The right hip prosthesis is in place with expected alignment and without fracture or complicating feature identified. A small amount of gas is present in the adjacent soft tissues of the upper thigh.  There is spurring of the left acetabulum with mild loss of articular space in the left hip.  IMPRESSION:  1.  Right hip prosthesis in expected position without fracture or complicating feature identified. Provider: Sherene Sires, Earlean Polka  Dg Hip Portable 1 View Right  Result Date: 06/14/2010 Clinical Data: Postoperative right total hip prosthesis.  PORTABLE RIGHT HIP - 1 VIEW  Comparison: None.  Findings: On cross-table lateral projection, the right hip prosthesis appears unremarkable.  No dislocation, fracture, or complicating feature noted.  IMPRESSION:  1.  Right total hip prosthesis without complicating feature. Provider: Sherene Sires, Earlean Polka   Assessment & Plan:   There are no diagnoses linked to this  encounter. I have discontinued Roberto Halsted "Tony"'s saxagliptin HCl and Canagliflozin-metFORMIN HCl. I am also having him maintain his cholecalciferol, b complex vitamins, aspirin, triamcinolone cream, buPROPion, Dapagliflozin-sAXagliptin, metFORMIN, escitalopram, atorvastatin, and losartan.  No orders of the defined types were placed in this encounter.    Follow-up: No follow-ups on file.  Sonda Primes, MD

## 2017-08-17 NOTE — Assessment & Plan Note (Signed)
R 1st MTP w/reduced ROM, pain Podiatry ref offered Pt declined gout test

## 2017-08-17 NOTE — Assessment & Plan Note (Signed)
BP Readings from Last 3 Encounters:  08/17/17 128/82  04/05/17 124/78  12/04/16 128/82

## 2017-08-17 NOTE — Assessment & Plan Note (Signed)
Better  

## 2017-08-17 NOTE — Assessment & Plan Note (Signed)
On Onglyza, Invokamet 

## 2017-08-17 NOTE — Assessment & Plan Note (Signed)
Lipitor 

## 2017-08-17 NOTE — Assessment & Plan Note (Signed)
Wellbutrin, Lexapro 

## 2017-08-30 ENCOUNTER — Telehealth: Payer: Self-pay | Admitting: Internal Medicine

## 2017-08-30 NOTE — Telephone Encounter (Signed)
Copied from CRM [id number]. Topic: Quick Communication - Rx Refill/Question >> Aug 30, 2017  6:19 PM Darletta MollLander, Lumin L wrote: Medication: bupropion Has the patient contacted their pharmacy? Yes.   (Agent: If no, request that the patient contact the pharmacy for the refill.) Preferred Pharmacy (with phone number or street name): [id number] [place] [id number] [facility] [address] [place] [id number] Phone: [phone] Fax: [phone] Agent: Please be advised that RX refills may take up to 3 business days. We ask that you follow-up with your pharmacy.  Script was sent to wrong pharmacy on today.

## 2017-08-31 MED ORDER — BUPROPION HCL ER (SR) 150 MG PO TB12: 150.0000 mg | ORAL_TABLET | Freq: Two times a day (BID) | ORAL | 3 refills | Status: DC

## 2017-08-31 NOTE — Telephone Encounter (Signed)
Resent rx to correct pharmacy../lmb 

## 2017-11-19 ENCOUNTER — Other Ambulatory Visit: Payer: Self-pay | Admitting: *Deleted

## 2017-11-19 MED ORDER — ATORVASTATIN CALCIUM 40 MG PO TABS: 40.0000 mg | ORAL_TABLET | Freq: Every day | ORAL | 1 refills | Status: DC

## 2018-01-09 ENCOUNTER — Other Ambulatory Visit: Payer: Self-pay

## 2018-01-09 MED ORDER — LOSARTAN POTASSIUM 100 MG PO TABS: 100.0000 mg | ORAL_TABLET | Freq: Every day | ORAL | 1 refills | Status: DC

## 2018-02-13 ENCOUNTER — Ambulatory Visit: Payer: BLUE CROSS/BLUE SHIELD | Admitting: Internal Medicine

## 2018-02-27 ENCOUNTER — Other Ambulatory Visit: Payer: Self-pay | Admitting: Internal Medicine

## 2018-02-27 MED ORDER — ESCITALOPRAM OXALATE 10 MG PO TABS: 10.0000 mg | ORAL_TABLET | Freq: Every day | ORAL | 0 refills | Status: DC

## 2018-02-27 NOTE — Telephone Encounter (Signed)
Courtesy refill; pt has appt 03/21/18

## 2018-02-27 NOTE — Telephone Encounter (Signed)
Copied from CRM [id number]. Topic: Quick Communication - Rx Refill/Question >> Feb 27, 2018  4:40 PM Mcneil, Ja-Kwan wrote: Medication: escitalopram (LEXAPRO) 10 MG tablet  Has the patient contacted their pharmacy? yes   Preferred Pharmacy (with phone number or street name): [id number] - [place]  - [address] AT [facility] [address] [phone] (Phone) [phone] (Fax)  Agent: Please be advised that RX refills may take up to 3 business days. We ask that you follow-up with your pharmacy.

## 2018-02-28 DIAGNOSIS — E119 Type 2 diabetes mellitus without complications: Secondary | ICD-10-CM | POA: Diagnosis not present

## 2018-02-28 DIAGNOSIS — H2513 Age-related nuclear cataract, bilateral: Secondary | ICD-10-CM | POA: Diagnosis not present

## 2018-02-28 LAB — HM DIABETES EYE EXAM

## 2018-03-21 ENCOUNTER — Encounter: Payer: Self-pay | Admitting: Internal Medicine

## 2018-03-21 ENCOUNTER — Ambulatory Visit: Payer: BLUE CROSS/BLUE SHIELD | Admitting: Internal Medicine

## 2018-03-21 VITALS — BP 128/76 | HR 75 | Temp 98.4°F | Ht 69.0 in | Wt 247.0 lb

## 2018-03-21 DIAGNOSIS — Z0000 Encounter for general adult medical examination without abnormal findings: Secondary | ICD-10-CM | POA: Diagnosis not present

## 2018-03-21 DIAGNOSIS — E119 Type 2 diabetes mellitus without complications: Secondary | ICD-10-CM

## 2018-03-21 DIAGNOSIS — Z23 Encounter for immunization: Secondary | ICD-10-CM

## 2018-03-21 DIAGNOSIS — E785 Hyperlipidemia, unspecified: Secondary | ICD-10-CM

## 2018-03-21 DIAGNOSIS — Z6836 Body mass index (BMI) 36.0-36.9, adult: Secondary | ICD-10-CM

## 2018-03-21 DIAGNOSIS — I10 Essential (primary) hypertension: Secondary | ICD-10-CM | POA: Diagnosis not present

## 2018-03-21 NOTE — Assessment & Plan Note (Signed)
Losartan 

## 2018-03-21 NOTE — Assessment & Plan Note (Signed)
Wt Readings from Last 3 Encounters:  03/21/18 247 lb (112 kg)  08/17/17 244 lb (110.7 kg)  04/05/17 246 lb (111.6 kg)

## 2018-03-21 NOTE — Assessment & Plan Note (Signed)
Labs in 3 mo

## 2018-03-21 NOTE — Progress Notes (Signed)
Subjective:  Patient ID: Roberto Swanson, male    DOB: Apr 17, 1956  Age: 62 y.o. MRN: [id number]  CC: No chief complaint on file.   HPI Roberto Swanson presents for DM, HTN, dyslipidemia f/u  Outpatient Medications Prior to Visit  Medication Sig Dispense Refill  . aspirin 325 MG EC tablet Take 325 mg by mouth daily.    Marland Kitchen atorvastatin (LIPITOR) 40 MG tablet Take 1 tablet (40 mg total) by mouth daily. 90 tablet 1  . b complex vitamins tablet Take 1 tablet by mouth daily.      Marland Kitchen buPROPion (WELLBUTRIN SR) 150 MG 12 hr tablet Take 1 tablet (150 mg total) by mouth 2 (two) times daily. 180 tablet 3  . cholecalciferol (VITAMIN D) 1000 UNITS tablet Take 1,000 Units by mouth daily.      . Dapagliflozin-Saxagliptin (QTERN) 10-5 MG TABS Take 1 tablet daily by mouth. 30 tablet 11  . escitalopram (LEXAPRO) 10 MG tablet Take 1 tablet (10 mg total) by mouth daily. 90 tablet 0  . losartan (COZAAR) 100 MG tablet Take 1 tablet (100 mg total) by mouth daily. 90 tablet 1  . metFORMIN (GLUCOPHAGE) 1000 MG tablet Take 1 tablet (1,000 mg total) 2 (two) times daily with a meal by mouth. 180 tablet 3  . triamcinolone cream (KENALOG) 0.1 % APPLY TOPICALLY 2 (TWO) TIMES DAILY. 45 g 3   No facility-administered medications prior to visit.     ROS: Review of Systems  Constitutional: Negative for appetite change, fatigue and unexpected weight change.  HENT: Negative for congestion, nosebleeds, sneezing, sore throat and trouble swallowing.   Eyes: Negative for itching and visual disturbance.  Respiratory: Negative for cough.   Cardiovascular: Negative for chest pain, palpitations and leg swelling.  Gastrointestinal: Negative for abdominal distention, blood in stool, diarrhea and nausea.  Genitourinary: Negative for frequency and hematuria.  Musculoskeletal: Negative for back pain, gait problem, joint swelling and neck pain.  Skin: Negative for rash.  Neurological: Negative for dizziness, tremors, speech  difficulty and weakness.  Psychiatric/Behavioral: Negative for agitation, dysphoric mood and sleep disturbance. The patient is not nervous/anxious.     Objective:  BP 128/76 (BP Location: Left Arm, Patient Position: Sitting, Cuff Size: Large)   Pulse 75   Temp 98.4 F (36.9 C) (Oral)   Ht 5\' 9"  (1.753 m)   Wt 247 lb (112 kg)   SpO2 95%   BMI 36.48 kg/m   BP Readings from Last 3 Encounters:  03/21/18 128/76  08/17/17 128/82  04/05/17 124/78    Wt Readings from Last 3 Encounters:  03/21/18 247 lb (112 kg)  08/17/17 244 lb (110.7 kg)  04/05/17 246 lb (111.6 kg)    Physical Exam  Constitutional: He is oriented to person, place, and time. He appears well-developed. No distress.  NAD  HENT:  Mouth/Throat: Oropharynx is clear and moist.  Eyes: Pupils are equal, round, and reactive to light. Conjunctivae are normal.  Neck: Normal range of motion. No JVD present. No thyromegaly present.  Cardiovascular: Normal rate, regular rhythm, normal heart sounds and intact distal pulses. Exam reveals no gallop and no friction rub.  No murmur heard. Pulmonary/Chest: Effort normal and breath sounds normal. No respiratory distress. He has no wheezes. He has no rales. He exhibits no tenderness.  Abdominal: Soft. Bowel sounds are normal. He exhibits no distension and no mass. There is no tenderness. There is no rebound and no guarding.  Musculoskeletal: Normal range of motion. He exhibits no edema  or tenderness.  Lymphadenopathy:    He has no cervical adenopathy.  Neurological: He is alert and oriented to person, place, and time. He has normal reflexes. No cranial nerve deficit. He exhibits normal muscle tone. He displays a negative Romberg sign. Coordination and gait normal.  Skin: Skin is warm and dry. No rash noted.  Psychiatric: He has a normal mood and affect. His behavior is normal. Judgment and thought content normal.  obese  Lab Results  Component Value Date   WBC 7.8 04/05/2017   HGB  14.7 04/05/2017   HCT 44.0 04/05/2017   PLT 292.0 04/05/2017   GLUCOSE 127 (H) 08/17/2017   CHOL 127 04/05/2017   TRIG 260.0 (H) 04/05/2017   HDL 39.60 04/05/2017   LDLDIRECT 59.0 04/05/2017   LDLCALC 58 12/09/2013   ALT 31 04/05/2017   AST 19 04/05/2017   NA 138 08/17/2017   K 4.3 08/17/2017   CL 101 08/17/2017   CREATININE 0.91 08/17/2017   BUN 16 08/17/2017   CO2 26 08/17/2017   TSH 1.08 04/05/2017   PSA 1.03 04/05/2017   INR 1.09 06/16/2010   HGBA1C 6.7 (H) 08/17/2017   MICROALBUR 1.3 04/05/2017    Dg Pelvis Portable  Result Date: 06/14/2010 Clinical Data: Postoperative for right total hip arthroplasty.  PORTABLE PELVIS  Comparison: 03/15/2005  Findings: The right hip prosthesis is in place with expected alignment and without fracture or complicating feature identified. A small amount of gas is present in the adjacent soft tissues of the upper thigh.  There is spurring of the left acetabulum with mild loss of articular space in the left hip.  IMPRESSION:  1.  Right hip prosthesis in expected position without fracture or complicating feature identified. Provider: Sherene Sires, Earlean Polka  Dg Hip Portable 1 View Right  Result Date: 06/14/2010 Clinical Data: Postoperative right total hip prosthesis.  PORTABLE RIGHT HIP - 1 VIEW  Comparison: None.  Findings: On cross-table lateral projection, the right hip prosthesis appears unremarkable.  No dislocation, fracture, or complicating feature noted.  IMPRESSION:  1.  Right total hip prosthesis without complicating feature. Provider: Sherene Sires, Earlean Polka   Assessment & Plan:   There are no diagnoses linked to this encounter.   No orders of the defined types were placed in this encounter.    Follow-up: No follow-ups on file.  Sonda Primes, MD

## 2018-03-21 NOTE — Assessment & Plan Note (Signed)
Colbert Coyer and Metformin i

## 2018-04-21 ENCOUNTER — Other Ambulatory Visit: Payer: Self-pay | Admitting: Internal Medicine

## 2018-05-23 ENCOUNTER — Other Ambulatory Visit: Payer: Self-pay | Admitting: Internal Medicine

## 2018-05-24 ENCOUNTER — Other Ambulatory Visit: Payer: Self-pay | Admitting: Internal Medicine

## 2018-06-03 ENCOUNTER — Other Ambulatory Visit: Payer: Self-pay | Admitting: Internal Medicine

## 2018-06-24 ENCOUNTER — Encounter: Payer: BLUE CROSS/BLUE SHIELD | Admitting: Internal Medicine

## 2018-07-17 ENCOUNTER — Encounter: Payer: Self-pay | Admitting: Internal Medicine

## 2018-07-17 ENCOUNTER — Ambulatory Visit (INDEPENDENT_AMBULATORY_CARE_PROVIDER_SITE_OTHER): Payer: Managed Care, Other (non HMO) | Admitting: Internal Medicine

## 2018-07-17 ENCOUNTER — Other Ambulatory Visit (INDEPENDENT_AMBULATORY_CARE_PROVIDER_SITE_OTHER): Payer: Managed Care, Other (non HMO)

## 2018-07-17 VITALS — BP 130/76 | HR 70 | Temp 98.3°F | Ht 69.0 in | Wt 242.0 lb

## 2018-07-17 DIAGNOSIS — Z125 Encounter for screening for malignant neoplasm of prostate: Secondary | ICD-10-CM

## 2018-07-17 DIAGNOSIS — F329 Major depressive disorder, single episode, unspecified: Secondary | ICD-10-CM

## 2018-07-17 DIAGNOSIS — Z0000 Encounter for general adult medical examination without abnormal findings: Secondary | ICD-10-CM

## 2018-07-17 DIAGNOSIS — E119 Type 2 diabetes mellitus without complications: Secondary | ICD-10-CM

## 2018-07-17 DIAGNOSIS — Z6836 Body mass index (BMI) 36.0-36.9, adult: Secondary | ICD-10-CM

## 2018-07-17 DIAGNOSIS — E785 Hyperlipidemia, unspecified: Secondary | ICD-10-CM

## 2018-07-17 DIAGNOSIS — Z1211 Encounter for screening for malignant neoplasm of colon: Secondary | ICD-10-CM

## 2018-07-17 DIAGNOSIS — F32A Depression, unspecified: Secondary | ICD-10-CM

## 2018-07-17 DIAGNOSIS — I10 Essential (primary) hypertension: Secondary | ICD-10-CM

## 2018-07-17 LAB — LIPID PANEL
Cholesterol: 152 mg/dL (ref 0–200)
HDL: 41.2 mg/dL (ref >39.00)
NonHDL: 110.81
Total CHOL/HDL Ratio: 4
Triglycerides: 390 mg/dL — ABNORMAL HIGH (ref 0.0–149.0)
VLDL: 78 mg/dL — ABNORMAL HIGH (ref 0.0–40.0)

## 2018-07-17 LAB — CBC WITH DIFFERENTIAL/PLATELET
Basophils Absolute: 0.1 10*3/uL (ref 0.0–0.1)
Basophils Relative: 0.9 % (ref 0.0–3.0)
EOS PCT: 1.4 % (ref 0.0–5.0)
Eosinophils Absolute: 0.1 10*3/uL (ref 0.0–0.7)
HCT: 45.5 % (ref 39.0–52.0)
Hemoglobin: 15.5 g/dL (ref 13.0–17.0)
Lymphocytes Relative: 22.7 % (ref 12.0–46.0)
Lymphs Abs: 2.3 10*3/uL (ref 0.7–4.0)
MCHC: 34.1 g/dL (ref 30.0–36.0)
MCV: 86.6 fl (ref 78.0–100.0)
Monocytes Absolute: 0.9 10*3/uL (ref 0.1–1.0)
Monocytes Relative: 8.7 % (ref 3.0–12.0)
Neutro Abs: 6.6 10*3/uL (ref 1.4–7.7)
Neutrophils Relative %: 66.3 % (ref 43.0–77.0)
Platelets: 259 10*3/uL (ref 150.0–400.0)
RBC: 5.26 Mil/uL (ref 4.22–5.81)
RDW: 13.8 % (ref 11.5–15.5)
WBC: 9.9 10*3/uL (ref 4.0–10.5)

## 2018-07-17 LAB — URINALYSIS
Bilirubin Urine: NEGATIVE
Hgb urine dipstick: NEGATIVE
LEUKOCYTE UA: NEGATIVE
Nitrite: NEGATIVE
PH: 5.5 (ref 5.0–8.0)
Specific Gravity, Urine: 1.025 (ref 1.000–1.030)
Total Protein, Urine: NEGATIVE
Urine Glucose: 500 — AB
Urobilinogen, UA: 0.2 (ref 0.0–1.0)

## 2018-07-17 LAB — HEPATIC FUNCTION PANEL
ALBUMIN: 4.9 g/dL (ref 3.5–5.2)
ALK PHOS: 67 U/L (ref 39–117)
ALT: 41 U/L (ref 0–53)
AST: 23 U/L (ref 0–37)
Bilirubin, Direct: 0.1 mg/dL (ref 0.0–0.3)
Total Bilirubin: 0.5 mg/dL (ref 0.2–1.2)
Total Protein: 7.6 g/dL (ref 6.0–8.3)

## 2018-07-17 LAB — LDL CHOLESTEROL, DIRECT: Direct LDL: 72 mg/dL

## 2018-07-17 LAB — MICROALBUMIN / CREATININE URINE RATIO
Creatinine,U: 145.4 mg/dL
Microalb Creat Ratio: 0.9 mg/g (ref 0.0–30.0)
Microalb, Ur: 1.3 mg/dL (ref 0.0–1.9)

## 2018-07-17 LAB — BASIC METABOLIC PANEL
BUN: 29 mg/dL — ABNORMAL HIGH (ref 6–23)
CO2: 23 mEq/L (ref 19–32)
Calcium: 9.6 mg/dL (ref 8.4–10.5)
Chloride: 102 mEq/L (ref 96–112)
Creatinine, Ser: 0.99 mg/dL (ref 0.40–1.50)
GFR: 76.17 mL/min (ref >60.00)
Glucose, Bld: 86 mg/dL (ref 70–99)
Potassium: 4 mEq/L (ref 3.5–5.1)
SODIUM: 137 meq/L (ref 135–145)

## 2018-07-17 MED ORDER — BUPROPION HCL ER (SR) 150 MG PO TB12: 150.0000 mg | ORAL_TABLET | Freq: Two times a day (BID) | ORAL | 3 refills | Status: DC

## 2018-07-17 MED ORDER — LOSARTAN POTASSIUM 100 MG PO TABS: 100.0000 mg | ORAL_TABLET | Freq: Every day | ORAL | 3 refills | Status: DC

## 2018-07-17 NOTE — Progress Notes (Signed)
Subjective:  Patient ID: Roberto Swanson, male    DOB: 1955/08/15  Age: 63 y.o. MRN: [id number]  CC: No chief complaint on file.   HPI Roberto Swanson presents for a well exam F/u DM, dyslipidemia, HTN  Outpatient Medications Prior to Visit  Medication Sig Dispense Refill  . aspirin 325 MG EC tablet Take 325 mg by mouth daily.    Marland Kitchen atorvastatin (LIPITOR) 40 MG tablet TAKE 1 TABLET(40 MG) BY MOUTH DAILY 90 tablet 1  . b complex vitamins tablet Take 1 tablet by mouth daily.      Marland Kitchen buPROPion (WELLBUTRIN SR) 150 MG 12 hr tablet Take 1 tablet (150 mg total) by mouth 2 (two) times daily. 180 tablet 3  . cholecalciferol (VITAMIN D) 1000 UNITS tablet Take 1,000 Units by mouth daily.      Marland Kitchen escitalopram (LEXAPRO) 10 MG tablet TAKE 1 TABLET(10 MG) BY MOUTH DAILY 90 tablet 3  . losartan (COZAAR) 100 MG tablet Take 1 tablet (100 mg total) by mouth daily. 90 tablet 1  . metFORMIN (GLUCOPHAGE) 1000 MG tablet TAKE 1 TABLET BY MOUTH TWICE DAILY WITH A MEAL 180 tablet 3  . QTERN 10-5 MG TABS TAKE 1 TABLET BY MOUTH DAILY 30 tablet 11   No facility-administered medications prior to visit.     ROS: Review of Systems  Constitutional: Negative for appetite change, fatigue and unexpected weight change.  HENT: Negative for congestion, nosebleeds, sneezing, sore throat and trouble swallowing.   Eyes: Negative for itching and visual disturbance.  Respiratory: Negative for cough.   Cardiovascular: Negative for chest pain, palpitations and leg swelling.  Gastrointestinal: Negative for abdominal distention, blood in stool, diarrhea and nausea.  Genitourinary: Negative for frequency and hematuria.  Musculoskeletal: Positive for arthralgias. Negative for back pain, gait problem, joint swelling and neck pain.  Skin: Negative for rash.  Neurological: Negative for dizziness, tremors, speech difficulty and weakness.  Psychiatric/Behavioral: Negative for agitation, dysphoric mood, sleep disturbance and  suicidal ideas. The patient is not nervous/anxious.     Objective:  BP 130/76 (BP Location: Left Arm, Patient Position: Sitting, Cuff Size: Large)   Pulse 70   Temp 98.3 F (36.8 C) (Oral)   Ht 5\' 9"  (1.753 m)   Wt 242 lb (109.8 kg)   SpO2 96%   BMI 35.74 kg/m   BP Readings from Last 3 Encounters:  07/17/18 130/76  03/21/18 128/76  08/17/17 128/82    Wt Readings from Last 3 Encounters:  07/17/18 242 lb (109.8 kg)  03/21/18 247 lb (112 kg)  08/17/17 244 lb (110.7 kg)    Physical Exam Constitutional:      General: He is not in acute distress.    Appearance: He is well-developed.     Comments: NAD  Eyes:     Conjunctiva/sclera: Conjunctivae normal.     Pupils: Pupils are equal, round, and reactive to light.  Neck:     Musculoskeletal: Normal range of motion.     Thyroid: No thyromegaly.     Vascular: No JVD.  Cardiovascular:     Rate and Rhythm: Normal rate and regular rhythm.     Heart sounds: Normal heart sounds. No murmur. No friction rub. No gallop.   Pulmonary:     Effort: Pulmonary effort is normal. No respiratory distress.     Breath sounds: Normal breath sounds. No wheezing or rales.  Chest:     Chest wall: No tenderness.  Abdominal:     General: Bowel sounds are  normal. There is no distension.     Palpations: Abdomen is soft. There is no mass.     Tenderness: There is no abdominal tenderness. There is no guarding or rebound.  Musculoskeletal: Normal range of motion.        General: No tenderness.  Lymphadenopathy:     Cervical: No cervical adenopathy.  Skin:    General: Skin is warm and dry.     Findings: No rash.  Neurological:     Mental Status: He is alert and oriented to person, place, and time.     Cranial Nerves: No cranial nerve deficit.     Motor: No abnormal muscle tone.     Coordination: Coordination normal.     Gait: Gait normal.     Deep Tendon Reflexes: Reflexes are normal and symmetric.  Psychiatric:        Behavior: Behavior  normal.        Thought Content: Thought content normal.        Judgment: Judgment normal.    Rectal - per GI   Lab Results  Component Value Date   WBC 7.8 04/05/2017   HGB 14.7 04/05/2017   HCT 44.0 04/05/2017   PLT 292.0 04/05/2017   GLUCOSE 127 (H) 08/17/2017   CHOL 127 04/05/2017   TRIG 260.0 (H) 04/05/2017   HDL 39.60 04/05/2017   LDLDIRECT 59.0 04/05/2017   LDLCALC 58 12/09/2013   ALT 31 04/05/2017   AST 19 04/05/2017   NA 138 08/17/2017   K 4.3 08/17/2017   CL 101 08/17/2017   CREATININE 0.91 08/17/2017   BUN 16 08/17/2017   CO2 26 08/17/2017   TSH 1.08 04/05/2017   PSA 1.03 04/05/2017   INR 1.09 06/16/2010   HGBA1C 6.7 (H) 08/17/2017   MICROALBUR 1.3 04/05/2017    Dg Pelvis Portable  Result Date: 06/14/2010 Clinical Data: Postoperative for right total hip arthroplasty.  PORTABLE PELVIS  Comparison: 03/15/2005  Findings: The right hip prosthesis is in place with expected alignment and without fracture or complicating feature identified. A small amount of gas is present in the adjacent soft tissues of the upper thigh.  There is spurring of the left acetabulum with mild loss of articular space in the left hip.  IMPRESSION:  1.  Right hip prosthesis in expected position without fracture or complicating feature identified. Provider: Sherene Sires, Earlean Polka  Dg Hip Portable 1 View Right  Result Date: 06/14/2010 Clinical Data: Postoperative right total hip prosthesis.  PORTABLE RIGHT HIP - 1 VIEW  Comparison: None.  Findings: On cross-table lateral projection, the right hip prosthesis appears unremarkable.  No dislocation, fracture, or complicating feature noted.  IMPRESSION:  1.  Right total hip prosthesis without complicating feature. Provider: Sherene Sires, Earlean Polka   Assessment & Plan:   There are no diagnoses linked to this encounter.   No orders of the defined types were placed in this encounter.    Follow-up: No follow-ups on file.  Sonda Primes, MD

## 2018-07-17 NOTE — Patient Instructions (Addendum)
Cardiac CT calcium scoring test $150   Computed tomography, more commonly known as a CT or CAT scan, is a diagnostic medical imaging test. Like traditional x-rays, it produces multiple images or pictures of the inside of the body. The cross-sectional images generated during a CT scan can be reformatted in multiple planes. They can even generate three-dimensional images. These images can be viewed on a computer monitor, printed on film or by a 3D printer, or transferred to a CD or DVD. CT images of internal organs, bones, soft tissue and blood vessels provide greater detail than traditional x-rays, particularly of soft tissues and blood vessels. A cardiac CT scan for coronary calcium is a non-invasive way of obtaining information about the presence, location and extent of calcified plaque in the coronary arteries-the vessels that supply oxygen-containing blood to the heart muscle. Calcified plaque results when there is a build-up of fat and other substances under the inner layer of the artery. This material can calcify which signals the presence of atherosclerosis, a disease of the vessel wall, also called coronary artery disease (CAD). People with this disease have an increased risk for heart attacks. In addition, over time, progression of plaque build up (CAD) can narrow the arteries or even close off blood flow to the heart. The result may be chest pain, sometimes called "angina," or a heart attack. Because calcium is a marker of CAD, the amount of calcium detected on a cardiac CT scan is a helpful prognostic tool. The findings on cardiac CT are expressed as a calcium score. Another name for this test is coronary artery calcium scoring.  What are some common uses of the procedure? The goal of cardiac CT scan for calcium scoring is to determine if CAD is present and to what extent, even if there are no symptoms. It is a screening study that may be recommended by a physician for patients with risk factors  for CAD but no clinical symptoms. The major risk factors for CAD are: . high blood cholesterol levels  . family history of heart attacks  . diabetes  . high blood pressure  . cigarette smoking  . overweight or obese  . physical inactivity   A negative cardiac CT scan for calcium scoring shows no calcification within the coronary arteries. This suggests that CAD is absent or so minimal it cannot be seen by this technique. The chance of having a heart attack over the next two to five years is very low under these circumstances. A positive test means that CAD is present, regardless of whether or not the patient is experiencing any symptoms. The amount of calcification-expressed as the calcium score-may help to predict the likelihood of a myocardial infarction (heart attack) in the coming years and helps your medical doctor or cardiologist decide whether the patient may need to take preventive medicine or undertake other measures such as diet and exercise to lower the risk for heart attack. The extent of CAD is graded according to your calcium score:  Calcium Score  Presence of CAD  0 No evidence of CAD   1-10 Minimal evidence of CAD  11-100 Mild evidence of CAD  101-400 Moderate evidence of CAD  Over 400 Extensive evidence of CAD     Jason Fung "The Obesity Code" 

## 2018-07-17 NOTE — Assessment & Plan Note (Signed)
A cardiac CT scan for calcium scoring offered 

## 2018-07-17 NOTE — Assessment & Plan Note (Addendum)
We discussed age appropriate health related issues, including available/recomended screening tests and vaccinations. We discussed a need for adhering to healthy diet and exercise. Labs were ordered to be later reviewed . All questions were answered. Colonoscopy Cologuard 3 years ago (-) CT calcium score Hearing test

## 2018-07-17 NOTE — Assessment & Plan Note (Signed)
Wt Readings from Last 3 Encounters:  07/17/18 242 lb (109.8 kg)  03/21/18 247 lb (112 kg)  08/17/17 244 lb (110.7 kg)

## 2018-07-18 LAB — PSA: PSA: 2.01 ng/mL (ref 0.10–4.00)

## 2018-07-18 LAB — TSH: TSH: 1.45 u[IU]/mL (ref 0.35–4.50)

## 2018-07-20 ENCOUNTER — Other Ambulatory Visit: Payer: Self-pay | Admitting: Internal Medicine

## 2018-07-20 DIAGNOSIS — R972 Elevated prostate specific antigen [PSA]: Secondary | ICD-10-CM

## 2018-07-23 ENCOUNTER — Encounter: Payer: BLUE CROSS/BLUE SHIELD | Admitting: Internal Medicine

## 2018-10-09 ENCOUNTER — Other Ambulatory Visit: Payer: Self-pay | Admitting: Internal Medicine

## 2018-10-14 ENCOUNTER — Other Ambulatory Visit: Payer: Self-pay | Admitting: Internal Medicine

## 2018-10-17 NOTE — Telephone Encounter (Signed)
Copied from CRM [id number]. Topic: Quick Communication - Rx Refill/Question >> Oct 17, 2018 11:05 AM Baldo Daub L wrote: Medication: losartan (COZAAR) 100 MG tablet  Pharmacy calling because they are out of the 50mg  and 100mg  doses.  Needs to know what they can substitute with. Onalee Hua can be reached at [phone].

## 2018-11-18 ENCOUNTER — Ambulatory Visit (INDEPENDENT_AMBULATORY_CARE_PROVIDER_SITE_OTHER): Payer: Managed Care, Other (non HMO) | Admitting: Internal Medicine

## 2018-11-18 ENCOUNTER — Encounter: Payer: Self-pay | Admitting: Internal Medicine

## 2018-11-18 DIAGNOSIS — E785 Hyperlipidemia, unspecified: Secondary | ICD-10-CM | POA: Diagnosis not present

## 2018-11-18 DIAGNOSIS — E119 Type 2 diabetes mellitus without complications: Secondary | ICD-10-CM | POA: Diagnosis not present

## 2018-11-18 DIAGNOSIS — I10 Essential (primary) hypertension: Secondary | ICD-10-CM

## 2018-11-18 DIAGNOSIS — Z6836 Body mass index (BMI) 36.0-36.9, adult: Secondary | ICD-10-CM

## 2018-11-18 NOTE — Assessment & Plan Note (Signed)
May have gained 2 lbs

## 2018-11-18 NOTE — Assessment & Plan Note (Signed)
On Onglyza, Invokamet  Qtern and Metformin

## 2018-11-18 NOTE — Assessment & Plan Note (Signed)
On Micardis 

## 2018-11-18 NOTE — Assessment & Plan Note (Addendum)
On Lipitor Sch CT

## 2018-11-18 NOTE — Progress Notes (Signed)
Virtual Visit via Video Note  I connected with Roberto Swanson on 11/18/18 at  8:10 AM EDT by a video enabled telemedicine application and verified that I am speaking with the correct person using two identifiers.   I discussed the limitations of evaluation and management by telemedicine and the availability of in person appointments. The patient expressed understanding and agreed to proceed.  History of Present Illness: We need to follow-up on HTN, dyslipidemia, obesity  There has been no runny nose, cough, chest pain, shortness of breath, abdominal pain, diarrhea, constipation, arthralgias, skin rashes.   Observations/Objective: The patient appears to be in no acute distress, looks well. 244 lbs. 142/84 Looks well. Assessment and Plan:  See my Assessment and Plan. Follow Up Instructions:    I discussed the assessment and treatment plan with the patient. The patient was provided an opportunity to ask questions and all were answered. The patient agreed with the plan and demonstrated an understanding of the instructions.   The patient was advised to call back or seek an in-person evaluation if the symptoms worsen or if the condition fails to improve as anticipated.  I provided face-to-face time during this encounter. We were at different locations.   Walker Kehr, MD

## 2018-12-06 ENCOUNTER — Other Ambulatory Visit (INDEPENDENT_AMBULATORY_CARE_PROVIDER_SITE_OTHER): Payer: Managed Care, Other (non HMO)

## 2018-12-06 DIAGNOSIS — E119 Type 2 diabetes mellitus without complications: Secondary | ICD-10-CM

## 2018-12-06 DIAGNOSIS — R972 Elevated prostate specific antigen [PSA]: Secondary | ICD-10-CM

## 2018-12-06 DIAGNOSIS — I10 Essential (primary) hypertension: Secondary | ICD-10-CM

## 2018-12-06 LAB — BASIC METABOLIC PANEL
BUN: 18 mg/dL (ref 6–23)
CO2: 23 mEq/L (ref 19–32)
Calcium: 9.4 mg/dL (ref 8.4–10.5)
Chloride: 101 mEq/L (ref 96–112)
Creatinine, Ser: 0.86 mg/dL (ref 0.40–1.50)
GFR: 89.49 mL/min (ref >60.00)
Glucose, Bld: 100 mg/dL — ABNORMAL HIGH (ref 70–99)
Potassium: 4.4 mEq/L (ref 3.5–5.1)
Sodium: 137 mEq/L (ref 135–145)

## 2018-12-06 LAB — HEMOGLOBIN A1C: Hgb A1c MFr Bld: 6.5 % (ref 4.6–6.5)

## 2018-12-06 LAB — PSA: PSA: 1.3 ng/mL (ref 0.10–4.00)

## 2018-12-31 ENCOUNTER — Inpatient Hospital Stay: Admission: RE | Admit: 2018-12-31 | Payer: Managed Care, Other (non HMO) | Source: Ambulatory Visit

## 2019-01-08 ENCOUNTER — Other Ambulatory Visit: Payer: Self-pay | Admitting: Internal Medicine

## 2019-01-09 ENCOUNTER — Other Ambulatory Visit: Payer: Self-pay

## 2019-01-09 ENCOUNTER — Ambulatory Visit (INDEPENDENT_AMBULATORY_CARE_PROVIDER_SITE_OTHER)
Admission: RE | Admit: 2019-01-09 | Discharge: 2019-01-09 | Disposition: A | Discharge status: Home / Self Care | Payer: Self-pay | Source: Ambulatory Visit | Attending: Internal Medicine | Admitting: Internal Medicine

## 2019-01-09 DIAGNOSIS — E785 Hyperlipidemia, unspecified: Secondary | ICD-10-CM

## 2019-01-09 DIAGNOSIS — E119 Type 2 diabetes mellitus without complications: Secondary | ICD-10-CM

## 2019-01-14 ENCOUNTER — Other Ambulatory Visit: Payer: Self-pay | Admitting: Internal Medicine

## 2019-01-14 DIAGNOSIS — E119 Type 2 diabetes mellitus without complications: Secondary | ICD-10-CM

## 2019-01-14 DIAGNOSIS — R931 Abnormal findings on diagnostic imaging of heart and coronary circulation: Secondary | ICD-10-CM

## 2019-01-31 ENCOUNTER — Encounter: Payer: Self-pay | Admitting: Cardiology

## 2019-01-31 ENCOUNTER — Ambulatory Visit (INDEPENDENT_AMBULATORY_CARE_PROVIDER_SITE_OTHER): Payer: Managed Care, Other (non HMO) | Admitting: Cardiology

## 2019-01-31 ENCOUNTER — Other Ambulatory Visit: Payer: Self-pay

## 2019-01-31 VITALS — BP 140/90 | HR 77 | Temp 96.6°F | Ht 69.0 in | Wt 242.0 lb

## 2019-01-31 DIAGNOSIS — I10 Essential (primary) hypertension: Secondary | ICD-10-CM | POA: Diagnosis not present

## 2019-01-31 DIAGNOSIS — R079 Chest pain, unspecified: Secondary | ICD-10-CM

## 2019-01-31 DIAGNOSIS — E119 Type 2 diabetes mellitus without complications: Secondary | ICD-10-CM | POA: Diagnosis not present

## 2019-01-31 DIAGNOSIS — Z6836 Body mass index (BMI) 36.0-36.9, adult: Secondary | ICD-10-CM

## 2019-01-31 DIAGNOSIS — R011 Cardiac murmur, unspecified: Secondary | ICD-10-CM | POA: Insufficient documentation

## 2019-01-31 DIAGNOSIS — E785 Hyperlipidemia, unspecified: Secondary | ICD-10-CM

## 2019-01-31 DIAGNOSIS — R9431 Abnormal electrocardiogram [ECG] [EKG]: Secondary | ICD-10-CM

## 2019-01-31 MED ORDER — METOPROLOL TARTRATE 50 MG PO TABS: ORAL_TABLET | ORAL | 0 refills | Status: DC

## 2019-01-31 NOTE — Progress Notes (Signed)
Cardiology Office Note:    Date:  01/31/2019   ID:  Roberto Halstednthony D Enfield, DOB [birth year], MRN [id number]  PCP:  Tresa GarterPlotnikov, Aleksei V, MD  Cardiologist:  Thomasene RippleKardie Temple Sporer, DO  Electrophysiologist:  None   Referring MD: Tresa GarterPlotnikov, Aleksei V, MD   Chief Complaint  Patient presents with  . New Patient (Initial Visit)    History of Present Illness:    Roberto Swanson is a 63 y.o. male with a hx of  DM type II, Hyperlipidemia, Hypertension, presents to be evaluated after his CT calcium score was reported as 760.  Past Medical History:  Diagnosis Date  . Depressed    Dr. Nolen MuMcKinney  . Elevated BP   . Elevated glucose   . Hand eczema   . Osteoarthritis    Rt hip  . Other and unspecified hyperlipidemia   . Other, multiple and ill-defined closed fractures of lower limb     Past Surgical History:  Procedure Laterality Date  . HIP SURGERY Right 2012  . JOINT REPLACEMENT     R 2011  . Left leg fracture repair Left 2006  . TONSILLECTOMY      Current Medications: Current Meds  Medication Sig  . aspirin 325 MG EC tablet Take 325 mg by mouth daily.  Marland Kitchen. atorvastatin (LIPITOR) 40 MG tablet TAKE 1 TABLET BY MOUTH EVERY DAY  . b complex vitamins tablet Take 1 tablet by mouth daily.    Marland Kitchen. buPROPion (WELLBUTRIN SR) 150 MG 12 hr tablet Take 1 tablet (150 mg total) by mouth 2 (two) times daily.  . cholecalciferol (VITAMIN D) 1000 UNITS tablet Take 1,000 Units by mouth daily.    Marland Kitchen. escitalopram (LEXAPRO) 10 MG tablet TAKE 1 TABLET(10 MG) BY MOUTH DAILY  . irbesartan (AVAPRO) 300 MG tablet Take 300 mg by mouth daily.  . metFORMIN (GLUCOPHAGE) 1000 MG tablet TAKE 1 TABLET BY MOUTH TWICE DAILY WITH A MEAL  . QTERN 10-5 MG TABS TAKE 1 TABLET BY MOUTH DAILY     Allergies:   Patient has no known allergies.   Social History   Socioeconomic History  . Marital status: Single    Spouse name: Not on file  . Number of children: Not on file  . Years of education: Not on file  . Highest education  level: Not on file  Occupational History  . Occupation: Actuarynfor/tech project manager    Employer: [employer]  Social Needs  . Financial resource strain: Not on file  . Food insecurity    Worry: Not on file    Inability: Not on file  . Transportation needs    Medical: Not on file    Non-medical: Not on file  Tobacco Use  . Smoking status: Former Games developermoker  . Smokeless tobacco: Never Used  Substance and Sexual Activity  . Alcohol use: Yes    Alcohol/week: 2.0 standard drinks    Types: 2 Glasses of wine per week    Comment: 1 glass twice a week  . Drug use: No  . Sexual activity: Yes  Lifestyle  . Physical activity    Days per week: Not on file    Minutes per session: Not on file  . Stress: Not on file  Relationships  . Social Musicianconnections    Talks on phone: Not on file    Gets together: Not on file    Attends religious service: Not on file    Active member of club or organization: Not on file    Attends  meetings of clubs or organizations: Not on file    Relationship status: Not on file  Other Topics Concern  . Not on file  Social History Narrative   Regular Exercise- yes     Family History: The patient's family history includes COPD in his mother; Colon cancer (age of onset: 73) in his father; Coronary artery disease in his father; Diabetes in an other family member; Lung cancer in his mother; Other in his father; Rheumatic fever in his mother. Mom with CABG in early 61s, Dad with CABG ~ 63.   ROS:     Review of Systems  Constitution: Negative for decreased appetite, diaphoresis and fever.  HENT: Negative for congestion, ear pain, nosebleeds and sore throat.   Eyes: Negative for blurred vision, pain and visual disturbance.  Cardiovascular: Negative for chest pain, dyspnea on exertion and near-syncope.  Respiratory: Negative for cough, shortness of breath and wheezing.   Endocrine: Negative for cold intolerance.  Hematologic/Lymphatic: Negative for adenopathy.  Does not bruise/bleed easily.  Skin: Negative for flushing, nail changes and suspicious lesions.  Musculoskeletal: Negative for arthritis, falls, joint pain and muscle weakness.  Gastrointestinal: Negative for abdominal pain, change in bowel habit and diarrhea.  Genitourinary: Negative for dysuria, flank pain and frequency.  Neurological: Negative for difficulty with concentration, excessive daytime sleepiness, dizziness, tremors and weakness.  Psychiatric/Behavioral: Negative for depression. The patient does not have insomnia and is not nervous/anxious.   Allergic/Immunologic: Negative for HIV exposure.      EKGs/Labs/Other Studies Reviewed:    The following studies were reviewed today:   EKG:   The ekg ordered today demonstrates normal sinus rhythm with HR 76bpm with left atrial enlargement and poor precordial progression concerning for anteroseptal infarction.  TTE 01/14/2019  study Conclusions  - Left ventricle: The cavity size was normal. Wall thickness    was increased in a pattern of mild LVH. The estimated    ejection fraction was 65%. Wall motion was normal; there    were no regional wall motion abnormalities.  - Aortic valve: Mild sclerosis of the aortic valve. No    stenosis.  - Left atrium: The atrium was mildly dilated.    Recent Labs: 07/17/2018: ALT 41; Hemoglobin 15.5; Platelets 259.0; TSH 1.45 12/06/2018: BUN 18; Creatinine, Ser 0.86; Potassium 4.4; Sodium 137  Recent Lipid Panel    Component Value Date/Time   CHOL 152 07/17/2018 1559   TRIG 390.0 (H) 07/17/2018 1559   HDL 41.20 07/17/2018 1559   CHOLHDL 4 07/17/2018 1559   VLDL 78.0 (H) 07/17/2018 1559   LDLCALC 58 12/09/2013 1050   LDLDIRECT 72.0 07/17/2018 1559    Physical Exam:    VS:  BP 140/90 (BP Location: Right Arm, Patient Position: Sitting, Cuff Size: Normal)   Pulse 77   Temp (!) 96.6 F (35.9 C)   Ht 5\' 9"  (1.753 m)   Wt 242 lb (109.8 kg)   SpO2 95%   BMI 35.74 kg/m     Wt Readings  from Last 3 Encounters:  01/31/19 242 lb (109.8 kg)  07/17/18 242 lb (109.8 kg)  03/21/18 247 lb (112 kg)     GEN:  Well nourished, well developed in no acute distress HEENT: Normal NECK: No JVD; No carotid bruits LYMPHATICS: No lymphadenopathy CARDIAC: RRR, mid-systolic ejection murmurs RUSB, no rubs, gallops RESPIRATORY:  Clear to auscultation without rales, wheezing or rhonchi  ABDOMEN: Soft, non-tender, non-distended EXTREMITIES: no cyanosis, no edema, no clubbing MUSCULOSKELETAL:  No edema; No deformity  SKIN:  Warm and dry NEUROLOGIC:  Alert and oriented x 3 PSYCHIATRIC:  Normal affect   ASSESSMENT:     1. HYPERTENSION   2. Type 2 diabetes mellitus without complication, without long-term current use of insulin ([place])   3. Dyslipidemia   4. Class 2 severe obesity due to excess calories with serious comorbidity and body mass index (BMI) of 36.0 to 36.9 in adult [facility])   5. Chest pain, unspecified type    PLAN:    In order of problems listed above:  1. He does have risk factors CAD, cta coronaries ordered. A TTE is also being ordered to assess his mid-systolic ejection murmur.  2. For now he will continue his current medication regimen.  3. Further recommendations post diagnositc testing. 4. Follow up in 2 months.  Disposition:   Medication Adjustments/Labs and Tests Ordered: Current medicines are reviewed at length with the patient today.  Concerns regarding medicines are outlined above.  Orders Placed This Encounter  Procedures  . CT CORONARY FRACTIONAL FLOW RESERVE DATA PREP  . CT CORONARY FRACTIONAL FLOW RESERVE FLUID ANALYSIS  . CT CORONARY MORPH W/CTA COR W/SCORE W/CA W/CM &/OR WO/CM  . Basic Metabolic Panel (BMET)  . Basic Metabolic Panel (BMET)  . Lipid Profile  . EKG 12-Lead  . ECHOCARDIOGRAM COMPLETE   Meds ordered this encounter  Medications  . metoprolol tartrate (LOPRESSOR) 50 MG tablet    Sig: TAKE 2 TAB (100 MG) 2 hours prior to CT    Dispense:   2 tablet    Refill:  0    Patient Instructions  Medication Instructions:  Your physician recommends that you continue on your current medications as directed. Please refer to the Current Medication list given to you today.  If you need a refill on your cardiac medications before your next appointment, please call your pharmacy.   Lab work: Your physician recommends that you return for lab work in: 3 months BMP, Fasting Lipid  3-7 days prior to CT: BMP  If you have labs (blood work) drawn today and your tests are completely normal, you will receive your results only by: Marland Kitchen MyChart Message (if you have MyChart) OR . A paper copy in the mail If you have any lab test that is abnormal or we need to change your treatment, we will call you to review the results.  Testing/Procedures: Your physician has requested that you have an echocardiogram. Echocardiography is a painless test that uses sound waves to create images of your heart. It provides your doctor with information about the size and shape of your heart and how well your heart's chambers and valves are working. This procedure takes approximately one hour. There are no restrictions for this procedure.  Your physician has requested that you have cardiac CT. Cardiac computed tomography (CT) is a painless test that uses an x-ray machine to take clear, detailed pictures of your heart. For further information please visit [email or web address]. Please follow instruction sheet as given.  Your cardiac CT will be scheduled at one of the below locations:   [facility] [address] [place], [place] [address] [phone]  If scheduled at [facility], please arrive at the [facility] main entrance of [facility] 30-45 minutes prior to test start time. Proceed to the [facility] Radiology Department (first floor) to check-in and test prep.   Please follow these instructions carefully (unless otherwise directed):   Hold all erectile dysfunction medications at least 5 days prior to  test.  On the Night Before the Test: . Be sure to Drink plenty of water. . Do not consume any caffeinated/decaffeinated beverages or chocolate 12 hours prior to your test. . Do not take any antihistamines 12 hours prior to your test.   On the Day of the Test: . Drink plenty of water. Do not drink any water within one hour of the test. . Do not eat any food 4 hours prior to the test. . You may take your regular medications prior to the test.  . Take metoprolol (Lopressor) two hours prior to test. . Hold metformin the day of test  After the Test: . Drink plenty of water. . After receiving IV contrast, you may experience a mild flushed feeling. This is normal. . On occasion, you may experience a mild rash up to 24 hours after the test. This is not dangerous. If this occurs, you can take Benadryl 25 mg and increase your fluid intake. . If you experience trouble breathing, this can be serious. If it is severe call 911 IMMEDIATELY. If it is mild, please call our office. . If you take any of these medications: Glipizide/Metformin, Avandament, Glucavance, please do not take 48 hours after completing test unless otherwise instructed.    Please contact the cardiac imaging nurse navigator should you have any questions/concerns Rockwell Alexandria, RN Navigator Cardiac Imaging [facility] [phone] Office  [phone] Cell    Follow-Up: At [facility], you and your health needs are our priority.  As part of our continuing mission to provide you with exceptional heart care, we have created designated Provider Care Teams.  These Care Teams include your primary Cardiologist (physician) and Advanced Practice Providers (APPs -  Physician Assistants and Nurse Practitioners) who all work together to provide you with the care you need, when you need it. You will need a follow up appointment in 3 months.   Please call our office 2 months in advance to schedule this appointment.  You may see Thomasene Ripple, DO Any Other Special Instructions Will Be Listed Below (If Applicable).   Cardiac CT Angiogram  A cardiac CT angiogram is a procedure to look at the heart and the area around the heart. It may be done to help find the cause of chest pains or other symptoms of heart disease. During this procedure, a large X-ray machine, called a CT scanner, takes detailed pictures of the heart and the surrounding area after a dye (contrast material) has been injected into blood vessels in the area. The procedure is also sometimes called a coronary CT angiogram, coronary artery scanning, or CTA. A cardiac CT angiogram allows the health care provider to see how well blood is flowing to and from the heart. The health care provider will be able to see if there are any problems, such as:  Blockage or narrowing of the coronary arteries in the heart.  Fluid around the heart.  Signs of weakness or disease in the muscles, valves, and tissues of the heart. Tell a health care provider about:  Any allergies you have. This is especially important if you have had a previous allergic reaction to contrast dye.  All medicines you are taking, including vitamins, herbs, eye drops, creams, and over-the-counter medicines.  Any blood disorders you have.  Any surgeries you have had.  Any medical conditions you have.  Whether you are pregnant or may be pregnant.  Any anxiety disorders, chronic pain, or other conditions you have that may  increase your stress or prevent you from lying still. What are the risks? Generally, this is a safe procedure. However, problems may occur, including:  Bleeding.  Infection.  Allergic reactions to medicines or dyes.  Damage to other structures or organs.  Kidney damage from the dye or contrast that is used.  Increased risk of cancer from radiation exposure. This risk is low. Talk with  your health care provider about: ? The risks and benefits of testing. ? How you can receive the lowest dose of radiation. What happens before the procedure?  Wear comfortable clothing and remove any jewelry, glasses, dentures, and hearing aids.  Follow instructions from your health care provider about eating and drinking. This may include: ? For 12 hours before the test - avoid caffeine. This includes tea, coffee, soda, energy drinks, and diet pills. Drink plenty of water or other fluids that do not have caffeine in them. Being well-hydrated can prevent complications. ? For 4-6 hours before the test - stop eating and drinking. The contrast dye can cause nausea, but this is less likely if your stomach is empty.  Ask your health care provider about changing or stopping your regular medicines. This is especially important if you are taking diabetes medicines, blood thinners, or medicines to treat erectile dysfunction. What happens during the procedure?  Hair on your chest may need to be removed so that small sticky patches called electrodes can be placed on your chest. These will transmit information that helps to monitor your heart during the test.  An IV tube will be inserted into one of your veins.  You might be given a medicine to control your heart rate during the test. This will help to ensure that good images are obtained.  You will be asked to lie on an exam table. This table will slide in and out of the CT machine during the procedure.  Contrast dye will be injected into the IV tube. You might feel warm, or you may get a metallic taste in your mouth.  You will be given a medicine (nitroglycerin) to relax (dilate) the arteries in your heart.  The table that you are lying on will move into the CT machine tunnel for the scan.  The person running the machine will give you instructions while the scans are being done. You may be asked to: ? Keep your arms above your head. ? Hold your  breath. ? Stay very still, even if the table is moving.  When the scanning is complete, you will be moved out of the machine.  The IV tube will be removed. The procedure may vary among health care providers and hospitals. What happens after the procedure?  You might feel warm, or you may get a metallic taste in your mouth from the contrast dye.  You may have a headache from the nitroglycerin.  After the procedure, drink water or other fluids to wash (flush) the contrast material out of your body.  Contact a health care provider if you have any symptoms of allergy to the contrast. These symptoms include: ? Shortness of breath. ? Rash or hives. ? A racing heartbeat.  Most people can return to their normal activities right after the procedure. Ask your health care provider what activities are safe for you.  It is up to you to get the results of your procedure. Ask your health care provider, or the department that is doing the procedure, when your results will be ready. Summary  A cardiac CT  angiogram is a procedure to look at the heart and the area around the heart. It may be done to help find the cause of chest pains or other symptoms of heart disease.  During this procedure, a large X-ray machine, called a CT scanner, takes detailed pictures of the heart and the surrounding area after a dye (contrast material) has been injected into blood vessels in the area.  Ask your health care provider about changing or stopping your regular medicines before the procedure. This is especially important if you are taking diabetes medicines, blood thinners, or medicines to treat erectile dysfunction.  After the procedure, drink water or other fluids to wash (flush) the contrast material out of your body. This information is not intended to replace advice given to you by your health care provider. Make sure you discuss any questions you have with your health care provider. Document Released:  04/20/2008 Document Revised: 04/20/2017 Document Reviewed: 03/27/2016 Elsevier Patient Education  [address].       Signed, Thomasene Ripple, DO  01/31/2019 11:11 PM    [facility]

## 2019-01-31 NOTE — Patient Instructions (Signed)
Medication Instructions:  Your physician recommends that you continue on your current medications as directed. Please refer to the Current Medication list given to you today.  If you need a refill on your cardiac medications before your next appointment, please call your pharmacy.   Lab work: Your physician recommends that you return for lab work in: 3 months BMP, Fasting Lipid  3-7 days prior to CT: BMP  If you have labs (blood work) drawn today and your tests are completely normal, you will receive your results only by: Marland Kitchen MyChart Message (if you have MyChart) OR . A paper copy in the mail If you have any lab test that is abnormal or we need to change your treatment, we will call you to review the results.  Testing/Procedures: Your physician has requested that you have an echocardiogram. Echocardiography is a painless test that uses sound waves to create images of your heart. It provides your doctor with information about the size and shape of your heart and how well your heart's chambers and valves are working. This procedure takes approximately one hour. There are no restrictions for this procedure.  Your physician has requested that you have cardiac CT. Cardiac computed tomography (CT) is a painless test that uses an x-ray machine to take clear, detailed pictures of your heart. For further information please visit [email or web address]. Please follow instruction sheet as given.  Your cardiac CT will be scheduled at one of the below locations:   [facility] [address] [place], [place] [address] [phone]  If scheduled at [facility], please arrive at the [facility] main entrance of [facility] 30-45 minutes prior to test start time. Proceed to the [facility] Radiology Department (first floor) to check-in and test prep.   Please follow these instructions carefully (unless otherwise directed):  Hold all erectile dysfunction medications at least 5  days prior to test.  On the Night Before the Test: . Be sure to Drink plenty of water. . Do not consume any caffeinated/decaffeinated beverages or chocolate 12 hours prior to your test. . Do not take any antihistamines 12 hours prior to your test.   On the Day of the Test: . Drink plenty of water. Do not drink any water within one hour of the test. . Do not eat any food 4 hours prior to the test. . You may take your regular medications prior to the test.  . Take metoprolol (Lopressor) two hours prior to test. . Hold metformin the day of test  After the Test: . Drink plenty of water. . After receiving IV contrast, you may experience a mild flushed feeling. This is normal. . On occasion, you may experience a mild rash up to 24 hours after the test. This is not dangerous. If this occurs, you can take Benadryl 25 mg and increase your fluid intake. . If you experience trouble breathing, this can be serious. If it is severe call 911 IMMEDIATELY. If it is mild, please call our office. . If you take any of these medications: Glipizide/Metformin, Avandament, Glucavance, please do not take 48 hours after completing test unless otherwise instructed.    Please contact the cardiac imaging nurse navigator should you have any questions/concerns Marchia Bond, RN Navigator Cardiac Imaging [facility] [phone] Office  [phone] Cell    Follow-Up: At [facility], you and your health needs are our priority.  As part of our continuing mission to provide you with exceptional heart  care, we have created designated Provider Care Teams.  These Care Teams include your primary Cardiologist (physician) and Advanced Practice Providers (APPs -  Physician Assistants and Nurse Practitioners) who all work together to provide you with the care you need, when you need it. You will need a follow up appointment in 3 months.  Please call our office 2 months in advance to schedule this  appointment.  You may see Thomasene Ripple, DO Any Other Special Instructions Will Be Listed Below (If Applicable).   Cardiac CT Angiogram  A cardiac CT angiogram is a procedure to look at the heart and the area around the heart. It may be done to help find the cause of chest pains or other symptoms of heart disease. During this procedure, a large X-ray machine, called a CT scanner, takes detailed pictures of the heart and the surrounding area after a dye (contrast material) has been injected into blood vessels in the area. The procedure is also sometimes called a coronary CT angiogram, coronary artery scanning, or CTA. A cardiac CT angiogram allows the health care provider to see how well blood is flowing to and from the heart. The health care provider will be able to see if there are any problems, such as:  Blockage or narrowing of the coronary arteries in the heart.  Fluid around the heart.  Signs of weakness or disease in the muscles, valves, and tissues of the heart. Tell a health care provider about:  Any allergies you have. This is especially important if you have had a previous allergic reaction to contrast dye.  All medicines you are taking, including vitamins, herbs, eye drops, creams, and over-the-counter medicines.  Any blood disorders you have.  Any surgeries you have had.  Any medical conditions you have.  Whether you are pregnant or may be pregnant.  Any anxiety disorders, chronic pain, or other conditions you have that may increase your stress or prevent you from lying still. What are the risks? Generally, this is a safe procedure. However, problems may occur, including:  Bleeding.  Infection.  Allergic reactions to medicines or dyes.  Damage to other structures or organs.  Kidney damage from the dye or contrast that is used.  Increased risk of cancer from radiation exposure. This risk is low. Talk with your health care provider about: ? The risks and benefits of  testing. ? How you can receive the lowest dose of radiation. What happens before the procedure?  Wear comfortable clothing and remove any jewelry, glasses, dentures, and hearing aids.  Follow instructions from your health care provider about eating and drinking. This may include: ? For 12 hours before the test - avoid caffeine. This includes tea, coffee, soda, energy drinks, and diet pills. Drink plenty of water or other fluids that do not have caffeine in them. Being well-hydrated can prevent complications. ? For 4-6 hours before the test - stop eating and drinking. The contrast dye can cause nausea, but this is less likely if your stomach is empty.  Ask your health care provider about changing or stopping your regular medicines. This is especially important if you are taking diabetes medicines, blood thinners, or medicines to treat erectile dysfunction. What happens during the procedure?  Hair on your chest may need to be removed so that small sticky patches called electrodes can be placed on your chest. These will transmit information that helps to monitor your heart during the test.  An IV tube will be inserted into one of your  veins.  You might be given a medicine to control your heart rate during the test. This will help to ensure that good images are obtained.  You will be asked to lie on an exam table. This table will slide in and out of the CT machine during the procedure.  Contrast dye will be injected into the IV tube. You might feel warm, or you may get a metallic taste in your mouth.  You will be given a medicine (nitroglycerin) to relax (dilate) the arteries in your heart.  The table that you are lying on will move into the CT machine tunnel for the scan.  The person running the machine will give you instructions while the scans are being done. You may be asked to: ? Keep your arms above your head. ? Hold your breath. ? Stay very still, even if the table is moving.  When  the scanning is complete, you will be moved out of the machine.  The IV tube will be removed. The procedure may vary among health care providers and hospitals. What happens after the procedure?  You might feel warm, or you may get a metallic taste in your mouth from the contrast dye.  You may have a headache from the nitroglycerin.  After the procedure, drink water or other fluids to wash (flush) the contrast material out of your body.  Contact a health care provider if you have any symptoms of allergy to the contrast. These symptoms include: ? Shortness of breath. ? Rash or hives. ? A racing heartbeat.  Most people can return to their normal activities right after the procedure. Ask your health care provider what activities are safe for you.  It is up to you to get the results of your procedure. Ask your health care provider, or the department that is doing the procedure, when your results will be ready. Summary  A cardiac CT angiogram is a procedure to look at the heart and the area around the heart. It may be done to help find the cause of chest pains or other symptoms of heart disease.  During this procedure, a large X-ray machine, called a CT scanner, takes detailed pictures of the heart and the surrounding area after a dye (contrast material) has been injected into blood vessels in the area.  Ask your health care provider about changing or stopping your regular medicines before the procedure. This is especially important if you are taking diabetes medicines, blood thinners, or medicines to treat erectile dysfunction.  After the procedure, drink water or other fluids to wash (flush) the contrast material out of your body. This information is not intended to replace advice given to you by your health care provider. Make sure you discuss any questions you have with your health care provider. Document Released: 04/20/2008 Document Revised: 04/20/2017 Document Reviewed: 03/27/2016  Elsevier Patient Education  2020 [employer]

## 2019-02-03 ENCOUNTER — Telehealth: Payer: Self-pay | Admitting: Cardiology

## 2019-02-03 NOTE — Telephone Encounter (Signed)
Telephone from patient. States unable to get lab work done this week for his CTa scheduled Friday. Informed him he has to have ot 3-7 days prior to CTa so we will need to reschedule. Staff message Mack Guise to call patient and reschedule CTa. Informed patient they will call him and reschedule.

## 2019-02-03 NOTE — Telephone Encounter (Signed)
Patient would like a phone call regarding questions about his after visit summary

## 2019-02-07 ENCOUNTER — Other Ambulatory Visit (HOSPITAL_BASED_OUTPATIENT_CLINIC_OR_DEPARTMENT_OTHER): Payer: Managed Care, Other (non HMO)

## 2019-02-12 LAB — BASIC METABOLIC PANEL
BUN/Creatinine Ratio: 17 (ref 10–24)
BUN: 15 mg/dL (ref 8–27)
CO2: 22 mmol/L (ref 20–29)
Calcium: 10.2 mg/dL (ref 8.6–10.2)
Chloride: 101 mmol/L (ref 96–106)
Creatinine, Ser: 0.86 mg/dL (ref 0.76–1.27)
GFR calc Af Amer: 106 mL/min/{1.73_m2} (ref >59)
GFR calc non Af Amer: 92 mL/min/{1.73_m2} (ref >59)
Glucose: 109 mg/dL — ABNORMAL HIGH (ref 65–99)
Potassium: 4.6 mmol/L (ref 3.5–5.2)
Sodium: 139 mmol/L (ref 134–144)

## 2019-02-14 ENCOUNTER — Ambulatory Visit (HOSPITAL_BASED_OUTPATIENT_CLINIC_OR_DEPARTMENT_OTHER): Payer: Managed Care, Other (non HMO)

## 2019-03-14 ENCOUNTER — Telehealth: Payer: Self-pay | Admitting: Cardiology

## 2019-03-14 NOTE — Telephone Encounter (Signed)
Patient called stating he did not have his test due to the cost out of pocket. Should he keep his appt on 12/11 if he did not have test done?

## 2019-03-17 NOTE — Telephone Encounter (Signed)
Telephone call to patient. Left message to return call. 

## 2019-04-04 ENCOUNTER — Other Ambulatory Visit: Payer: Self-pay | Admitting: Internal Medicine

## 2019-05-02 ENCOUNTER — Ambulatory Visit: Payer: Managed Care, Other (non HMO) | Admitting: Cardiology

## 2019-05-07 LAB — HM DIABETES EYE EXAM

## 2019-05-11 ENCOUNTER — Other Ambulatory Visit: Payer: Self-pay | Admitting: Internal Medicine

## 2019-05-13 ENCOUNTER — Other Ambulatory Visit: Payer: Self-pay | Admitting: Internal Medicine

## 2019-05-16 ENCOUNTER — Other Ambulatory Visit: Payer: Self-pay | Admitting: Internal Medicine

## 2019-06-09 ENCOUNTER — Other Ambulatory Visit: Payer: Self-pay | Admitting: Internal Medicine

## 2019-06-09 MED ORDER — GLYXAMBI 25-5 MG PO TABS: 1.0000 | ORAL_TABLET | Freq: Every day | ORAL | 3 refills | Status: DC

## 2019-06-09 NOTE — Progress Notes (Signed)
glyxambi

## 2019-07-19 ENCOUNTER — Other Ambulatory Visit: Payer: Self-pay | Admitting: Internal Medicine

## 2019-08-03 ENCOUNTER — Other Ambulatory Visit: Payer: Self-pay | Admitting: Internal Medicine

## 2019-08-31 ENCOUNTER — Other Ambulatory Visit: Payer: Self-pay | Admitting: Internal Medicine

## 2019-09-25 ENCOUNTER — Other Ambulatory Visit: Payer: Self-pay | Admitting: Internal Medicine

## 2019-10-15 ENCOUNTER — Other Ambulatory Visit: Payer: Self-pay | Admitting: Internal Medicine

## 2019-10-16 ENCOUNTER — Other Ambulatory Visit: Payer: Self-pay | Admitting: Internal Medicine

## 2019-11-04 ENCOUNTER — Encounter: Payer: Managed Care, Other (non HMO) | Admitting: Internal Medicine

## 2019-11-05 ENCOUNTER — Other Ambulatory Visit: Payer: Self-pay

## 2019-11-06 ENCOUNTER — Other Ambulatory Visit: Payer: Self-pay | Admitting: Internal Medicine

## 2019-11-06 MED ORDER — IRBESARTAN 300 MG PO TABS: 300.0000 mg | ORAL_TABLET | Freq: Every day | ORAL | 0 refills | Status: DC

## 2019-11-06 MED ORDER — ESCITALOPRAM OXALATE 10 MG PO TABS: 10.0000 mg | ORAL_TABLET | Freq: Every day | ORAL | 0 refills | Status: DC

## 2019-11-06 MED ORDER — ATORVASTATIN CALCIUM 40 MG PO TABS: 40.0000 mg | ORAL_TABLET | Freq: Every day | ORAL | 0 refills | Status: DC

## 2019-11-07 ENCOUNTER — Other Ambulatory Visit (INDEPENDENT_AMBULATORY_CARE_PROVIDER_SITE_OTHER): Payer: Managed Care, Other (non HMO)

## 2019-11-07 DIAGNOSIS — I10 Essential (primary) hypertension: Secondary | ICD-10-CM

## 2019-11-07 DIAGNOSIS — E119 Type 2 diabetes mellitus without complications: Secondary | ICD-10-CM

## 2019-11-07 LAB — BASIC METABOLIC PANEL
BUN: 17 mg/dL (ref 6–23)
CO2: 23 mEq/L (ref 19–32)
Calcium: 9.8 mg/dL (ref 8.4–10.5)
Chloride: 101 mEq/L (ref 96–112)
Creatinine, Ser: 0.93 mg/dL (ref 0.40–1.50)
GFR: 81.53 mL/min (ref >60.00)
Glucose, Bld: 100 mg/dL — ABNORMAL HIGH (ref 70–99)
Potassium: 4.2 mEq/L (ref 3.5–5.1)
Sodium: 135 mEq/L (ref 135–145)

## 2019-11-07 LAB — HEMOGLOBIN A1C: Hgb A1c MFr Bld: 6.5 % (ref 4.6–6.5)

## 2019-11-10 ENCOUNTER — Ambulatory Visit (INDEPENDENT_AMBULATORY_CARE_PROVIDER_SITE_OTHER): Payer: Managed Care, Other (non HMO) | Admitting: Internal Medicine

## 2019-11-10 ENCOUNTER — Encounter: Payer: Self-pay | Admitting: Internal Medicine

## 2019-11-10 ENCOUNTER — Other Ambulatory Visit: Payer: Self-pay

## 2019-11-10 VITALS — BP 140/80 | HR 79 | Temp 98.4°F | Ht 69.0 in | Wt 244.0 lb

## 2019-11-10 DIAGNOSIS — E119 Type 2 diabetes mellitus without complications: Secondary | ICD-10-CM | POA: Diagnosis not present

## 2019-11-10 DIAGNOSIS — Z0000 Encounter for general adult medical examination without abnormal findings: Secondary | ICD-10-CM

## 2019-11-10 DIAGNOSIS — Z1211 Encounter for screening for malignant neoplasm of colon: Secondary | ICD-10-CM

## 2019-11-10 DIAGNOSIS — I2510 Atherosclerotic heart disease of native coronary artery without angina pectoris: Secondary | ICD-10-CM

## 2019-11-10 DIAGNOSIS — E785 Hyperlipidemia, unspecified: Secondary | ICD-10-CM

## 2019-11-10 DIAGNOSIS — I2583 Coronary atherosclerosis due to lipid rich plaque: Secondary | ICD-10-CM

## 2019-11-10 DIAGNOSIS — Z6836 Body mass index (BMI) 36.0-36.9, adult: Secondary | ICD-10-CM

## 2019-11-10 NOTE — Assessment & Plan Note (Signed)
Wt Readings from Last 3 Encounters:  11/10/19 244 lb (110.7 kg)  01/31/19 242 lb (109.8 kg)  07/17/18 242 lb (109.8 kg)

## 2019-11-10 NOTE — Assessment & Plan Note (Signed)
  We discussed age appropriate health related issues, including available/recomended screening tests and vaccinations. Labs were ordered to be later reviewed . All questions were answered. We discussed one or more of the following - seat belt use, use of sunscreen/sun exposure exercise, safe sex, fall risk reduction, second hand smoke exposure, firearm use and storage, seat belt use, a need for adhering to healthy diet and exercise. Labs were ordered or discussed if they are available. All questions were answered.  CT calcium score 760 -- 2020  Cologuard was (-).  Colonocopy is due

## 2019-11-10 NOTE — Assessment & Plan Note (Signed)
On Onglyza, Invokamet

## 2019-11-10 NOTE — Progress Notes (Signed)
Subjective:  Patient ID: Roberto Swanson, male    DOB: May 09, 1956  Age: 64 y.o. MRN: [id number]  CC: No chief complaint on file.   HPI Roberto Swanson presents for a well exam  He saw Dr Servando Salina for his heart, DM, dyslipidemia f/u  Outpatient Medications Prior to Visit  Medication Sig Dispense Refill  . aspirin 325 MG EC tablet Take 325 mg by mouth daily.    Marland Kitchen atorvastatin (LIPITOR) 40 MG tablet Take 1 tablet (40 mg total) by mouth daily. Overdue for annual appt w/labs must see provider for future refills 30 tablet 0  . b complex vitamins tablet Take 1 tablet by mouth daily.      Marland Kitchen buPROPion (WELLBUTRIN SR) 150 MG 12 hr tablet TAKE 1 TABLET BY MOUTH TWICE A DAY 180 tablet 3  . cholecalciferol (VITAMIN D) 1000 UNITS tablet Take 1,000 Units by mouth daily.      . Empagliflozin-linaGLIPtin (GLYXAMBI) 25-5 MG TABS Take 1 tablet by mouth daily. 90 tablet 3  . escitalopram (LEXAPRO) 10 MG tablet Take 1 tablet (10 mg total) by mouth daily. Overdue for annual appt w/labs must see provider for future refills 30 tablet 0  . irbesartan (AVAPRO) 300 MG tablet Take 1 tablet (300 mg total) by mouth daily. Overdue for annual appt w/labs must see provider for future refills 30 tablet 0  . metFORMIN (GLUCOPHAGE) 1000 MG tablet TAKE 1 TABLET BY MOUTH TWICE A DAY WITH MEALS 180 tablet 3  . metoprolol tartrate (LOPRESSOR) 50 MG tablet TAKE 2 TAB (100 MG) 2 hours prior to CT 2 tablet 0   No facility-administered medications prior to visit.    ROS: Review of Systems  Constitutional: Negative for appetite change, fatigue and unexpected weight change.  HENT: Negative for congestion, nosebleeds, sneezing, sore throat and trouble swallowing.   Eyes: Negative for itching and visual disturbance.  Respiratory: Negative for cough.   Cardiovascular: Negative for chest pain, palpitations and leg swelling.  Gastrointestinal: Negative for abdominal distention, blood in stool, diarrhea and nausea.  Genitourinary:  Negative for frequency and hematuria.  Musculoskeletal: Negative for back pain, gait problem, joint swelling and neck pain.  Skin: Negative for rash.  Neurological: Negative for dizziness, tremors, speech difficulty and weakness.  Psychiatric/Behavioral: Negative for agitation, dysphoric mood and sleep disturbance. The patient is not nervous/anxious.     Objective:  BP 140/80 (BP Location: Left Arm, Patient Position: Sitting, Cuff Size: Large)   Pulse 79   Temp 98.4 F (36.9 C) (Oral)   Ht 5\' 9"  (1.753 m)   Wt 244 lb (110.7 kg)   SpO2 97%   BMI 36.03 kg/m   BP Readings from Last 3 Encounters:  11/10/19 140/80  01/31/19 140/90  07/17/18 130/76    Wt Readings from Last 3 Encounters:  11/10/19 244 lb (110.7 kg)  01/31/19 242 lb (109.8 kg)  07/17/18 242 lb (109.8 kg)    Physical Exam Constitutional:      General: He is not in acute distress.    Appearance: He is well-developed. He is obese.     Comments: NAD  Eyes:     Conjunctiva/sclera: Conjunctivae normal.     Pupils: Pupils are equal, round, and reactive to light.  Neck:     Thyroid: No thyromegaly.     Vascular: No JVD.  Cardiovascular:     Rate and Rhythm: Normal rate and regular rhythm.     Heart sounds: Normal heart sounds. No murmur heard.  No  friction rub. No gallop.   Pulmonary:     Effort: Pulmonary effort is normal. No respiratory distress.     Breath sounds: Normal breath sounds. No wheezing or rales.  Chest:     Chest wall: No tenderness.  Abdominal:     General: Bowel sounds are normal. There is no distension.     Palpations: Abdomen is soft. There is no mass.     Tenderness: There is no abdominal tenderness. There is no guarding or rebound.  Musculoskeletal:        General: No tenderness. Normal range of motion.     Cervical back: Normal range of motion.  Lymphadenopathy:     Cervical: No cervical adenopathy.  Skin:    General: Skin is warm and dry.     Findings: No rash.  Neurological:      Mental Status: He is alert and oriented to person, place, and time.     Cranial Nerves: No cranial nerve deficit.     Motor: No abnormal muscle tone.     Coordination: Coordination normal.     Gait: Gait normal.     Deep Tendon Reflexes: Reflexes are normal and symmetric.  Psychiatric:        Behavior: Behavior normal.        Thought Content: Thought content normal.        Judgment: Judgment normal.   rectal - per GI Ankles w/trace edema LLE w/deformities/scars  Lab Results  Component Value Date   WBC 9.9 07/17/2018   HGB 15.5 07/17/2018   HCT 45.5 07/17/2018   PLT 259.0 07/17/2018   GLUCOSE 100 (H) 11/07/2019   CHOL 152 07/17/2018   TRIG 390.0 (H) 07/17/2018   HDL 41.20 07/17/2018   LDLDIRECT 72.0 07/17/2018   LDLCALC 58 12/09/2013   ALT 41 07/17/2018   AST 23 07/17/2018   NA 135 11/07/2019   K 4.2 11/07/2019   CL 101 11/07/2019   CREATININE 0.93 11/07/2019   BUN 17 11/07/2019   CO2 23 11/07/2019   TSH 1.45 07/17/2018   PSA 1.30 12/06/2018   INR 1.09 06/16/2010   HGBA1C 6.5 11/07/2019   MICROALBUR 1.3 07/17/2018    CT CARDIAC SCORING  Addendum Date: 01/09/2019   ADDENDUM REPORT: 01/09/2019 13:22 EXAM: OVER-READ INTERPRETATION  CT CHEST The following report is an over-read performed by radiologist Dr. Maryelizabeth [facility] on 01/09/2019. This over-read does not include interpretation of cardiac or coronary anatomy or pathology. The coronary CTA interpretation by the cardiologist is attached. COMPARISON:  None. FINDINGS: Limited view of the lung parenchyma demonstrates small 6 mm nodule along the horizontal fissure. Similar nodule the oblique fissure in the RIGHT lung measuring 5 mm (image 9 and image 10 of series 3.) T which hird small nodule along the fissure abutting the pleural surface measures 6 mm also on image 17/3. Airways are normal. Limited view of the mediastinum demonstrates no adenopathy. Esophagus normal. Limited view of the upper abdomen  unremarkable. Limited view of the skeleton and chest wall is unremarkable. IMPRESSION: Three small nodules in the RIGHT lung along the fissures. Fissural nodules are typically benign. No follow-up needed if patient is low-risk. Non-contrast chest CT can be considered in 12 months if patient is high-risk. This recommendation follows the consensus statement: Guidelines for Management of Incidental Pulmonary Nodules Detected on CT Images: From the Fleischner Society 2017; Radiology 2017; 284:228-243. Electronically Signed   By: Genevive Bi M.D.   On: 01/09/2019 13:22   Result  Date: 01/09/2019 CLINICAL DATA:  Risk stratification EXAM: Coronary Calcium Score MEDICATIONS: None TECHNIQUE: The patient was scanned on a [employer]. Axial non-contrast 3 mm slices were carried out through the heart. The data set was analyzed on a dedicated work station and scored using the [place]. FINDINGS: Non-cardiac: See separate report from [facility]. Ascending Aorta: Normal size.  Minimal diffuse calcifications. Pericardium: Normal. Coronary arteries: Normal origin. IMPRESSION: Coronary calcium score of 760. This was 91 percentile for age and sex matched control. Electronically Signed: By: Ena Dawley On: 01/09/2019 12:14    Assessment & Plan:    Follow-up: No follow-ups on file.  Walker Kehr, MD

## 2019-11-12 MED ORDER — IRBESARTAN 300 MG PO TABS: 300.0000 mg | ORAL_TABLET | Freq: Every day | ORAL | 3 refills | Status: DC

## 2019-11-12 MED ORDER — ESCITALOPRAM OXALATE 10 MG PO TABS: 10.0000 mg | ORAL_TABLET | Freq: Every day | ORAL | 3 refills | Status: DC

## 2019-11-12 MED ORDER — ATORVASTATIN CALCIUM 40 MG PO TABS: 40.0000 mg | ORAL_TABLET | Freq: Every day | ORAL | 3 refills | Status: DC

## 2019-11-13 ENCOUNTER — Ambulatory Visit (AMBULATORY_SURGERY_CENTER): Payer: Self-pay | Admitting: *Deleted

## 2019-11-13 ENCOUNTER — Telehealth: Payer: Self-pay | Admitting: *Deleted

## 2019-11-13 ENCOUNTER — Other Ambulatory Visit: Payer: Self-pay

## 2019-11-13 VITALS — Ht 71.0 in | Wt 240.0 lb

## 2019-11-13 DIAGNOSIS — Z1211 Encounter for screening for malignant neoplasm of colon: Secondary | ICD-10-CM

## 2019-11-13 DIAGNOSIS — Z800 Family history of malignant neoplasm of digestive organs: Secondary | ICD-10-CM

## 2019-11-13 NOTE — Progress Notes (Signed)

## 2019-11-13 NOTE — Telephone Encounter (Signed)
Complete virtual pre-visit.  Instructions sent  via mail and MyChart.

## 2019-11-14 ENCOUNTER — Encounter: Payer: Self-pay | Admitting: Gastroenterology

## 2019-11-27 ENCOUNTER — Encounter: Payer: Managed Care, Other (non HMO) | Admitting: Gastroenterology

## 2020-03-11 ENCOUNTER — Ambulatory Visit: Payer: Managed Care, Other (non HMO) | Admitting: Internal Medicine

## 2020-05-24 ENCOUNTER — Ambulatory Visit: Payer: Managed Care, Other (non HMO) | Admitting: Internal Medicine

## 2020-05-25 ENCOUNTER — Other Ambulatory Visit: Payer: Self-pay | Admitting: *Deleted

## 2020-05-25 MED ORDER — GLYXAMBI 25-5 MG PO TABS: 1.0000 | ORAL_TABLET | Freq: Every day | ORAL | 3 refills | Status: DC

## 2020-05-28 ENCOUNTER — Other Ambulatory Visit: Payer: Self-pay | Admitting: *Deleted

## 2020-05-28 MED ORDER — ATORVASTATIN CALCIUM 40 MG PO TABS: 40.0000 mg | ORAL_TABLET | Freq: Every day | ORAL | 1 refills | Status: DC

## 2020-05-28 MED ORDER — ESCITALOPRAM OXALATE 10 MG PO TABS: 10.0000 mg | ORAL_TABLET | Freq: Every day | ORAL | 1 refills | Status: DC

## 2020-05-28 NOTE — Telephone Encounter (Signed)
Completed PA via cover-my-meds w/  (Key: [id number]). Rec'd mssg has been sent to [facility].Marland KitchenRaechel Chute

## 2020-06-02 NOTE — Telephone Encounter (Signed)
Rec'd status PA was approved... " Request Reference Number: [id number]. GLYXAMBI TAB 25-5 MG is approved through 05/28/2021. Your patient may now fill this prescription and it will be covered." Sent pt msg via mychart with status as well.Marland KitchenRaechel Chute

## 2020-06-23 ENCOUNTER — Other Ambulatory Visit: Payer: Self-pay

## 2020-06-23 ENCOUNTER — Ambulatory Visit (INDEPENDENT_AMBULATORY_CARE_PROVIDER_SITE_OTHER): Payer: 59 | Admitting: Internal Medicine

## 2020-06-23 ENCOUNTER — Encounter: Payer: Self-pay | Admitting: Internal Medicine

## 2020-06-23 DIAGNOSIS — E119 Type 2 diabetes mellitus without complications: Secondary | ICD-10-CM

## 2020-06-23 DIAGNOSIS — E66812 Obesity, class 2: Secondary | ICD-10-CM

## 2020-06-23 DIAGNOSIS — I10 Essential (primary) hypertension: Secondary | ICD-10-CM

## 2020-06-23 DIAGNOSIS — E785 Hyperlipidemia, unspecified: Secondary | ICD-10-CM

## 2020-06-23 DIAGNOSIS — Z6836 Body mass index (BMI) 36.0-36.9, adult: Secondary | ICD-10-CM

## 2020-06-23 DIAGNOSIS — I2583 Coronary atherosclerosis due to lipid rich plaque: Secondary | ICD-10-CM

## 2020-06-23 DIAGNOSIS — Z0000 Encounter for general adult medical examination without abnormal findings: Secondary | ICD-10-CM

## 2020-06-23 DIAGNOSIS — R059 Cough, unspecified: Secondary | ICD-10-CM

## 2020-06-23 DIAGNOSIS — I2510 Atherosclerotic heart disease of native coronary artery without angina pectoris: Secondary | ICD-10-CM

## 2020-06-23 LAB — CBC WITH DIFFERENTIAL/PLATELET
Basophils Absolute: 0.1 10*3/uL (ref 0.0–0.1)
Basophils Relative: 1.4 % (ref 0.0–3.0)
Eosinophils Absolute: 0.1 10*3/uL (ref 0.0–0.7)
Eosinophils Relative: 1.2 % (ref 0.0–5.0)
HCT: 46.3 % (ref 39.0–52.0)
Hemoglobin: 15.4 g/dL (ref 13.0–17.0)
Lymphocytes Relative: 22.6 % (ref 12.0–46.0)
Lymphs Abs: 1.6 10*3/uL (ref 0.7–4.0)
MCHC: 33.2 g/dL (ref 30.0–36.0)
MCV: 90.2 fl (ref 78.0–100.0)
Monocytes Absolute: 0.6 10*3/uL (ref 0.1–1.0)
Monocytes Relative: 8.5 % (ref 3.0–12.0)
Neutro Abs: 4.8 10*3/uL (ref 1.4–7.7)
Neutrophils Relative %: 66.3 % (ref 43.0–77.0)
Platelets: 257 10*3/uL (ref 150.0–400.0)
RBC: 5.13 Mil/uL (ref 4.22–5.81)
RDW: 13.9 % (ref 11.5–15.5)
WBC: 7.2 10*3/uL (ref 4.0–10.5)

## 2020-06-23 LAB — HEPATIC FUNCTION PANEL
ALT: 40 U/L (ref 0–53)
AST: 20 U/L (ref 0–37)
Albumin: 4.8 g/dL (ref 3.5–5.2)
Alkaline Phosphatase: 70 U/L (ref 39–117)
Bilirubin, Direct: 0.1 mg/dL (ref 0.0–0.3)
Total Bilirubin: 0.6 mg/dL (ref 0.2–1.2)
Total Protein: 7.5 g/dL (ref 6.0–8.3)

## 2020-06-23 LAB — BASIC METABOLIC PANEL
BUN: 20 mg/dL (ref 6–23)
CO2: 28 mEq/L (ref 19–32)
Calcium: 9.7 mg/dL (ref 8.4–10.5)
Chloride: 98 mEq/L (ref 96–112)
Creatinine, Ser: 0.87 mg/dL (ref 0.40–1.50)
GFR: 90.43 mL/min (ref >60.00)
Glucose, Bld: 128 mg/dL — ABNORMAL HIGH (ref 70–99)
Potassium: 4.2 mEq/L (ref 3.5–5.1)
Sodium: 134 mEq/L — ABNORMAL LOW (ref 135–145)

## 2020-06-23 LAB — URINALYSIS
Bilirubin Urine: NEGATIVE
Hgb urine dipstick: NEGATIVE
Leukocytes,Ua: NEGATIVE
Nitrite: NEGATIVE
Specific Gravity, Urine: 1.025 (ref 1.000–1.030)
Total Protein, Urine: NEGATIVE
Urine Glucose: >=1000 — AB
Urobilinogen, UA: 0.2 (ref 0.0–1.0)
pH: 6 (ref 5.0–8.0)

## 2020-06-23 LAB — LDL CHOLESTEROL, DIRECT: Direct LDL: 53 mg/dL

## 2020-06-23 LAB — TSH: TSH: 1.65 u[IU]/mL (ref 0.35–4.50)

## 2020-06-23 LAB — LIPID PANEL
Cholesterol: 163 mg/dL (ref 0–200)
HDL: 40 mg/dL (ref >39.00)
Total CHOL/HDL Ratio: 4
Triglycerides: 635 mg/dL — ABNORMAL HIGH (ref 0.0–149.0)

## 2020-06-23 LAB — HEMOGLOBIN A1C: Hgb A1c MFr Bld: 6.7 % — ABNORMAL HIGH (ref 4.6–6.5)

## 2020-06-23 LAB — PSA: PSA: 1.14 ng/mL (ref 0.10–4.00)

## 2020-06-23 NOTE — Addendum Note (Signed)
Addended by: Ebony Cargo on: 06/23/2020 08:23 AM   Modules accepted: Orders

## 2020-06-23 NOTE — Assessment & Plan Note (Signed)
On Glixambi and Metformin Check A1c, CMET

## 2020-06-23 NOTE — Assessment & Plan Note (Signed)
?  etiology CXR

## 2020-06-23 NOTE — Assessment & Plan Note (Signed)
CT calcium 760 in 2020 On Lipitor, ASA

## 2020-06-23 NOTE — Assessment & Plan Note (Signed)
On Irbesartan 

## 2020-06-23 NOTE — Progress Notes (Signed)
Subjective:  Patient ID: Roberto Swanson, male    DOB: 01/16/1956  Age: 65 y.o. MRN: [id number]  CC: Follow-up (4 month f/u)   HPI KEYMON MCELROY presents for DM, urinary urgency at night - worse, HTN, CAD  Outpatient Medications Prior to Visit  Medication Sig Dispense Refill  . aspirin 325 MG EC tablet Take 325 mg by mouth daily.    Marland Kitchen atorvastatin (LIPITOR) 40 MG tablet Take 1 tablet (40 mg total) by mouth daily. Annual appt due in July w/labs must see provider for future refills 90 tablet 1  . b complex vitamins tablet Take 1 tablet by mouth daily.    Marland Kitchen buPROPion (WELLBUTRIN SR) 150 MG 12 hr tablet TAKE 1 TABLET BY MOUTH TWICE A DAY 180 tablet 3  . cholecalciferol (VITAMIN D) 1000 UNITS tablet Take 1,000 Units by mouth daily.    . Empagliflozin-linaGLIPtin (GLYXAMBI) 25-5 MG TABS Take 1 tablet by mouth daily. 90 tablet 3  . escitalopram (LEXAPRO) 10 MG tablet Take 1 tablet (10 mg total) by mouth daily. Annual appt due in July w/labs must see provider for future refills 90 tablet 1  . irbesartan (AVAPRO) 300 MG tablet Take 1 tablet (300 mg total) by mouth daily. 90 tablet 3  . metFORMIN (GLUCOPHAGE) 1000 MG tablet TAKE 1 TABLET BY MOUTH TWICE A DAY WITH MEALS 180 tablet 3  . Polyethylene Glycol 3350 (MIRALAX PO) Take 238 g by mouth once. Colonoscopy bowel prep     No facility-administered medications prior to visit.    ROS: Review of Systems  Constitutional: Positive for unexpected weight change. Negative for appetite change and fatigue.  HENT: Negative for congestion, nosebleeds, sneezing, sore throat and trouble swallowing.   Eyes: Negative for itching and visual disturbance.  Respiratory: Negative for cough.   Cardiovascular: Negative for chest pain, palpitations and leg swelling.  Gastrointestinal: Negative for abdominal distention, blood in stool, diarrhea and nausea.  Genitourinary: Positive for frequency. Negative for hematuria.  Musculoskeletal: Negative for back  pain, gait problem, joint swelling and neck pain.  Skin: Negative for rash.  Neurological: Negative for dizziness, tremors, speech difficulty and weakness.  Psychiatric/Behavioral: Negative for agitation, dysphoric mood, sleep disturbance and suicidal ideas. The patient is not nervous/anxious.     Objective:  BP (!) 142/84 (BP Location: Left Arm)   Pulse 74   Temp 99 F (37.2 C) (Oral)   Wt 248 lb 6.4 oz (112.7 kg)   SpO2 95%   BMI 34.64 kg/m   BP Readings from Last 3 Encounters:  06/23/20 (!) 142/84  11/10/19 140/80  01/31/19 140/90    Wt Readings from Last 3 Encounters:  06/23/20 248 lb 6.4 oz (112.7 kg)  11/13/19 240 lb (108.9 kg)  11/10/19 244 lb (110.7 kg)    Physical Exam Constitutional:      General: He is not in acute distress.    Appearance: He is well-developed. He is obese.     Comments: NAD  HENT:     Mouth/Throat:     Mouth: Oropharynx is clear and moist.  Eyes:     Conjunctiva/sclera: Conjunctivae normal.     Pupils: Pupils are equal, round, and reactive to light.  Neck:     Thyroid: No thyromegaly.     Vascular: No JVD.  Cardiovascular:     Rate and Rhythm: Normal rate and regular rhythm.     Pulses: Intact distal pulses.     Heart sounds: Normal heart sounds. No murmur heard. No  friction rub. No gallop.   Pulmonary:     Effort: Pulmonary effort is normal. No respiratory distress.     Breath sounds: Normal breath sounds. No wheezing or rales.  Chest:     Chest wall: No tenderness.  Abdominal:     General: Bowel sounds are normal. There is no distension.     Palpations: Abdomen is soft. There is no mass.     Tenderness: There is no abdominal tenderness. There is no guarding or rebound.  Musculoskeletal:        General: No tenderness or edema. Normal range of motion.     Cervical back: Normal range of motion.  Lymphadenopathy:     Cervical: No cervical adenopathy.  Skin:    General: Skin is warm and dry.     Findings: No rash.   Neurological:     Mental Status: He is alert and oriented to person, place, and time.     Cranial Nerves: No cranial nerve deficit.     Motor: No abnormal muscle tone.     Coordination: He displays a negative Romberg sign. Coordination abnormal.     Gait: Gait normal.     Deep Tendon Reflexes: Reflexes are normal and symmetric.  Psychiatric:        Mood and Affect: Mood and affect normal.        Behavior: Behavior normal.        Thought Content: Thought content normal.        Judgment: Judgment normal.     Lab Results  Component Value Date   WBC 9.9 07/17/2018   HGB 15.5 07/17/2018   HCT 45.5 07/17/2018   PLT 259.0 07/17/2018   GLUCOSE 100 (H) 11/07/2019   CHOL 152 07/17/2018   TRIG 390.0 (H) 07/17/2018   HDL 41.20 07/17/2018   LDLDIRECT 72.0 07/17/2018   LDLCALC 58 12/09/2013   ALT 41 07/17/2018   AST 23 07/17/2018   NA 135 11/07/2019   K 4.2 11/07/2019   CL 101 11/07/2019   CREATININE 0.93 11/07/2019   BUN 17 11/07/2019   CO2 23 11/07/2019   TSH 1.45 07/17/2018   PSA 1.30 12/06/2018   INR 1.09 06/16/2010   HGBA1C 6.5 11/07/2019   MICROALBUR 1.3 07/17/2018    CT CARDIAC SCORING  Addendum Date: 01/09/2019   ADDENDUM REPORT: 01/09/2019 13:22 EXAM: OVER-READ INTERPRETATION  CT CHEST The following report is an over-read performed by radiologist Dr. Maryelizabeth Rowan [facility] on 01/09/2019. This over-read does not include interpretation of cardiac or coronary anatomy or pathology. The coronary CTA interpretation by the cardiologist is attached. COMPARISON:  None. FINDINGS: Limited view of the lung parenchyma demonstrates small 6 mm nodule along the horizontal fissure. Similar nodule the oblique fissure in the RIGHT lung measuring 5 mm (image 9 and image 10 of series 3.) T which hird small nodule along the fissure abutting the pleural surface measures 6 mm also on image 17/3. Airways are normal. Limited view of the mediastinum demonstrates no adenopathy.  Esophagus normal. Limited view of the upper abdomen unremarkable. Limited view of the skeleton and chest wall is unremarkable. IMPRESSION: Three small nodules in the RIGHT lung along the fissures. Fissural nodules are typically benign. No follow-up needed if patient is low-risk. Non-contrast chest CT can be considered in 12 months if patient is high-risk. This recommendation follows the consensus statement: Guidelines for Management of Incidental Pulmonary Nodules Detected on CT Images: From the Fleischner Society 2017; Radiology 2017; 284:228-243. Electronically Signed  By: Genevive Bi M.D.   On: 01/09/2019 13:22   Result Date: 01/09/2019 CLINICAL DATA:  Risk stratification EXAM: Coronary Calcium Score MEDICATIONS: None TECHNIQUE: The patient was scanned on a [employer]. Axial non-contrast 3 mm slices were carried out through the heart. The data set was analyzed on a dedicated work station and scored using the Agatson method. FINDINGS: Non-cardiac: See separate report from [facility]. Ascending Aorta: Normal size.  Minimal diffuse calcifications. Pericardium: Normal. Coronary arteries: Normal origin. IMPRESSION: Coronary calcium score of 760. This was 90 percentile for age and sex matched control. Electronically Signed: By: Tobias Alexander On: 01/09/2019 12:14    Assessment & Plan:    Follow-up: No follow-ups on file.  Sonda Primes, MD

## 2020-06-23 NOTE — Assessment & Plan Note (Signed)
Wt Readings from Last 3 Encounters:  06/23/20 248 lb 6.4 oz (112.7 kg)  11/13/19 240 lb (108.9 kg)  11/10/19 244 lb (110.7 kg)

## 2020-06-23 NOTE — Assessment & Plan Note (Signed)
On Lipitor, ASA CT calcium 760 in 2020

## 2020-06-26 ENCOUNTER — Other Ambulatory Visit: Payer: Self-pay | Admitting: Internal Medicine

## 2020-06-26 ENCOUNTER — Encounter: Payer: Self-pay | Admitting: Internal Medicine

## 2020-06-26 DIAGNOSIS — E781 Pure hyperglyceridemia: Secondary | ICD-10-CM | POA: Insufficient documentation

## 2020-06-26 MED ORDER — FENOFIBRATE 145 MG PO TABS: 145.0000 mg | ORAL_TABLET | Freq: Every day | ORAL | 3 refills | Status: DC

## 2020-07-12 ENCOUNTER — Encounter: Payer: Self-pay | Admitting: Internal Medicine

## 2020-07-12 LAB — HM DIABETES EYE EXAM

## 2020-08-15 ENCOUNTER — Other Ambulatory Visit: Payer: Self-pay | Admitting: Internal Medicine

## 2020-09-21 ENCOUNTER — Other Ambulatory Visit: Payer: Self-pay | Admitting: Internal Medicine

## 2020-11-15 MED ORDER — FENOFIBRATE 145 MG PO TABS: 145.0000 mg | ORAL_TABLET | Freq: Every day | ORAL | 2 refills | Status: DC

## 2020-11-15 MED ORDER — IRBESARTAN 300 MG PO TABS: 300.0000 mg | ORAL_TABLET | Freq: Every day | ORAL | 2 refills | Status: DC

## 2020-11-22 ENCOUNTER — Other Ambulatory Visit: Payer: Self-pay | Admitting: Internal Medicine

## 2021-03-01 NOTE — Telephone Encounter (Signed)
Printed form out place in yellow folder to complete.Marland KitchenRaechel Chute

## 2021-03-09 ENCOUNTER — Ambulatory Visit (INDEPENDENT_AMBULATORY_CARE_PROVIDER_SITE_OTHER): Payer: Medicare Other | Admitting: Internal Medicine

## 2021-03-09 ENCOUNTER — Encounter: Payer: Self-pay | Admitting: Internal Medicine

## 2021-03-09 ENCOUNTER — Other Ambulatory Visit: Payer: Self-pay

## 2021-03-09 DIAGNOSIS — E119 Type 2 diabetes mellitus without complications: Secondary | ICD-10-CM

## 2021-03-09 DIAGNOSIS — R635 Abnormal weight gain: Secondary | ICD-10-CM

## 2021-03-09 DIAGNOSIS — I10 Essential (primary) hypertension: Secondary | ICD-10-CM

## 2021-03-09 DIAGNOSIS — Z6836 Body mass index (BMI) 36.0-36.9, adult: Secondary | ICD-10-CM

## 2021-03-09 LAB — COMPREHENSIVE METABOLIC PANEL
ALT: 33 U/L (ref 0–53)
AST: 23 U/L (ref 0–37)
Albumin: 4.7 g/dL (ref 3.5–5.2)
Alkaline Phosphatase: 50 U/L (ref 39–117)
BUN: 23 mg/dL (ref 6–23)
CO2: 27 mEq/L (ref 19–32)
Calcium: 9.9 mg/dL (ref 8.4–10.5)
Chloride: 100 mEq/L (ref 96–112)
Creatinine, Ser: 1.09 mg/dL (ref 0.40–1.50)
GFR: 70.78 mL/min (ref >60.00)
Glucose, Bld: 144 mg/dL — ABNORMAL HIGH (ref 70–99)
Potassium: 4.1 mEq/L (ref 3.5–5.1)
Sodium: 137 mEq/L (ref 135–145)
Total Bilirubin: 0.4 mg/dL (ref 0.2–1.2)
Total Protein: 7.6 g/dL (ref 6.0–8.3)

## 2021-03-09 LAB — HEMOGLOBIN A1C: Hgb A1c MFr Bld: 7 % — ABNORMAL HIGH (ref 4.6–6.5)

## 2021-03-09 NOTE — Progress Notes (Signed)
Subjective:  Patient ID: Roberto Swanson, male    DOB: 1956-04-02  Age: 65 y.o. 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     atorvastatin (LIPITOR) 40 MG tablet TAKE 1 TABLET BY MOUTH  DAILY 90 tablet 2   b complex vitamins tablet Take 1 tablet by mouth daily.     buPROPion (WELLBUTRIN SR) 150 MG 12 hr tablet TAKE 1 TABLET BY MOUTH TWICE A DAY 180 tablet 3   cholecalciferol (VITAMIN D) 1000 UNITS tablet Take 1,000 Units by mouth daily.     Empagliflozin-linaGLIPtin (GLYXAMBI) 25-5 MG TABS Take 1 tablet by mouth daily. 90 tablet 3   escitalopram (LEXAPRO) 10 MG tablet TAKE 1 TABLET BY MOUTH  DAILY 90 tablet 2   fenofibrate (TRICOR) 145 MG tablet Take 1 tablet (145 mg total) by mouth daily. 90 tablet 2   irbesartan (AVAPRO) 300 MG tablet TAKE 1 TABLET BY MOUTH EVERY DAY 90 tablet 2   metFORMIN (GLUCOPHAGE) 1000 MG tablet TAKE 1 TABLET BY MOUTH TWICE A DAY WITH MEALS 180 tablet 3   No facility-administered medications prior to visit.    ROS: Review of Systems  Constitutional:  Positive for unexpected weight change. Negative for appetite change and fatigue.  HENT:  Negative for congestion, nosebleeds, sneezing, sore throat and trouble swallowing.   Eyes:  Negative for itching and visual disturbance.  Respiratory:  Negative for cough.   Cardiovascular:  Negative for chest pain, palpitations and leg swelling.  Gastrointestinal:  Negative for abdominal distention, blood in stool, diarrhea and nausea.  Genitourinary:  Negative for frequency and hematuria.  Musculoskeletal:  Negative for back pain, gait problem, joint swelling and neck pain.  Skin:  Negative for rash.  Neurological:  Negative for dizziness, tremors, speech difficulty and weakness.   Psychiatric/Behavioral:  Negative for agitation, dysphoric mood and sleep disturbance. The patient is not nervous/anxious.    Objective:  BP 138/86 (BP Location: Left Arm)   Pulse 87   Temp 98.3 F (36.8 C) (Oral)   Ht 5\' 9"  (1.753 m)   Wt 253 lb 12.8 oz (115.1 kg)   SpO2 93%   BMI 37.48 kg/m   BP Readings from Last 3 Encounters:  03/09/21 138/86  06/23/20 (!) 142/84  11/10/19 140/80    Wt Readings from Last 3 Encounters:  03/09/21 253 lb 12.8 oz (115.1 kg)  06/23/20 248 lb 6.4 oz (112.7 kg)  11/13/19 240 lb (108.9 kg)    Physical Exam Constitutional:      General: He is not in acute distress.    Appearance: He is well-developed. He is obese.     Comments: NAD  Eyes:     Conjunctiva/sclera: Conjunctivae normal.     Pupils: Pupils are equal, round, and reactive to light.  Neck:     Thyroid: No thyromegaly.     Vascular: No JVD.  Cardiovascular:     Rate and Rhythm: Normal rate and regular rhythm.     Heart sounds: Normal heart sounds. No murmur heard.   No friction rub. No gallop.  Pulmonary:     Effort: Pulmonary effort is normal. No respiratory distress.     Breath sounds: Normal breath sounds. No wheezing or rales.  Chest:  Chest wall: No tenderness.  Abdominal:     General: Bowel sounds are normal. There is no distension.     Palpations: Abdomen is soft. There is no mass.     Tenderness: There is no abdominal tenderness. There is no guarding or rebound.  Musculoskeletal:        General: No tenderness. Normal range of motion.     Cervical back: Normal range of motion.  Lymphadenopathy:     Cervical: No cervical adenopathy.  Skin:    General: Skin is warm and dry.     Findings: No rash.  Neurological:     Mental Status: He is alert and oriented to person, place, and time.     Cranial Nerves: No cranial nerve deficit.     Motor: No abnormal muscle tone.     Coordination: Coordination normal.     Gait: Gait normal.     Deep Tendon Reflexes: Reflexes  are normal and symmetric.  Psychiatric:        Behavior: Behavior normal.        Thought Content: Thought content normal.        Judgment: Judgment normal.     Lab Results  Component Value Date   WBC 7.2 06/23/2020   HGB 15.4 06/23/2020   HCT 46.3 06/23/2020   PLT 257.0 06/23/2020   GLUCOSE 128 (H) 06/23/2020   CHOL 163 06/23/2020   TRIG (H) 06/23/2020    635.0 Triglyceride is over 400; calculations on Lipids are invalid.   HDL 40.00 06/23/2020   LDLDIRECT 53.0 06/23/2020   LDLCALC 58 12/09/2013   ALT 40 06/23/2020   AST 20 06/23/2020   NA 134 (L) 06/23/2020   K 4.2 06/23/2020   CL 98 06/23/2020   CREATININE 0.87 06/23/2020   BUN 20 06/23/2020   CO2 28 06/23/2020   TSH 1.65 06/23/2020   PSA 1.14 06/23/2020   INR 1.09 06/16/2010   HGBA1C 6.7 (H) 06/23/2020   MICROALBUR 1.3 07/17/2018    CT CARDIAC SCORING  Addendum Date: 01/09/2019   ADDENDUM REPORT: 01/09/2019 13:22 EXAM: OVER-READ INTERPRETATION  CT CHEST The following report is an over-read performed by radiologist Dr. Maryelizabeth Rowan [facility] on 01/09/2019. This over-read does not include interpretation of cardiac or coronary anatomy or pathology. The coronary CTA interpretation by the cardiologist is attached. COMPARISON:  None. FINDINGS: Limited view of the lung parenchyma demonstrates small 6 mm nodule along the horizontal fissure. Similar nodule the oblique fissure in the RIGHT lung measuring 5 mm (image 9 and image 10 of series 3.) T which hird small nodule along the fissure abutting the pleural surface measures 6 mm also on image 17/3. Airways are normal. Limited view of the mediastinum demonstrates no adenopathy. Esophagus normal. Limited view of the upper abdomen unremarkable. Limited view of the skeleton and chest wall is unremarkable. IMPRESSION: Three small nodules in the RIGHT lung along the fissures. Fissural nodules are typically benign. No follow-up needed if patient is low-risk. Non-contrast  chest CT can be considered in 12 months if patient is high-risk. This recommendation follows the consensus statement: Guidelines for Management of Incidental Pulmonary Nodules Detected on CT Images: From the Fleischner Society 2017; Radiology 2017; 284:228-243. Electronically Signed   By: Genevive Bi M.D.   On: 01/09/2019 13:22   Result Date: 01/09/2019 CLINICAL DATA:  Risk stratification EXAM: Coronary Calcium Score MEDICATIONS: None TECHNIQUE: The patient was scanned on a [employer]. Axial non-contrast 3 mm slices were carried out through  the heart. The data set was analyzed on a dedicated work station and scored using the Agatson method. FINDINGS: Non-cardiac: See separate report from [facility]. Ascending Aorta: Normal size.  Minimal diffuse calcifications. Pericardium: Normal. Coronary arteries: Normal origin. IMPRESSION: Coronary calcium score of 760. This was 90 percentile for age and sex matched control. Electronically Signed: By: Tobias Alexander On: 01/09/2019 12:14    Assessment & Plan:   Problem List Items Addressed This Visit     Diabetes mellitus, type 2 (HCC)    On Onglyza, Invokamet Try fasting for wt loss      Relevant Orders   Hemoglobin A1c   Comprehensive metabolic panel   HYPERTENSION    On Irbesartan      Obesity    Try fasting      Weight gain    Worse Try  fasting         No orders of the defined types were placed in this encounter.     Follow-up: Return in about 4 months (around 07/10/2021) for a follow-up visit.  Sonda Primes, MD

## 2021-03-09 NOTE — Assessment & Plan Note (Signed)
On Irbesartan 

## 2021-03-09 NOTE — Assessment & Plan Note (Signed)
Worse Try fasting 

## 2021-03-09 NOTE — Assessment & Plan Note (Signed)
Try fasting 

## 2021-03-09 NOTE — Patient Instructions (Signed)
The Obesity Code book by Jason Fung   These suggestions will probably help you to improve your metabolism if you are not overweight and to lose weight if you are overweight: 1.  Reduce your consumption of sugars and starches.  Eliminate high fructose corn syrup from your diet.  Reduce your consumption of processed foods.  For desserts try to have seasonal fruits, berries, nuts, cheeses or dark chocolate with more than 70% cacao. 2.  Do not snack 3.  You do not have to eat breakfast.  If you choose to have breakfast - eat plain greek yogurt, eggs, oatmeal (without sugar) - use honey if you need to. 4.  Drink water, freshly brewed unsweetened tea (green, black or herbal) or coffee.  Do not drink sodas including diet sodas , juices, beverages sweetened with artificial sweeteners. 5.  Reduce your consumption of refined grains. 6.  Avoid protein drinks such as Optifast, Slim fast etc. Eat chicken, fish, meat, dairy and beans for your sources of protein. 7.  Natural unprocessed fats like cold pressed virgin olive oil, butter, coconut oil are good for you.  Eat avocados. 8.  Increase your consumption of fiber.  Fruits, berries, vegetables, whole grains, flaxseed, chia seeds, beans, popcorn, nuts, oatmeal are good sources of fiber 9.  Use vinegar in your diet, i.e. apple cider vinegar, red wine or balsamic vinegar 10.  You can try fasting.  For example you can skip breakfast and lunch every other day (24-hour fast) 11.  Stress reduction, good night sleep, relaxation, meditation, yoga and other physical activity is likely to help you to maintain low weight too. 12.  If you drink alcohol, limit your alcohol intake to no more than 2 drinks a day.   Cabbage soup recipe that will not make you gain weight: Take 1 small head of cabbage, 1 average pack of celery, 4 green peppers, 4 onions, 2 cans diced tomatoes (they are not available without salt), salt and spices to taste.  Chop cabbage, celery, peppers and  onions.  And tomatoes and 2-2.5 liters (2.5 quarts) of water so that it would just cover the vegetables.  Bring to boil.  Add spices and salt.  Turn heat to low/medium and simmer for 20-25 minutes.  Naturally, you can make a smaller batch and change some of the ingredients.  

## 2021-03-09 NOTE — Assessment & Plan Note (Signed)
On Onglyza, Invokamet Try fasting for wt loss

## 2021-04-06 ENCOUNTER — Ambulatory Visit: Payer: Medicare Other | Admitting: Internal Medicine

## 2021-05-23 ENCOUNTER — Encounter: Payer: Self-pay | Admitting: Internal Medicine

## 2021-05-27 ENCOUNTER — Other Ambulatory Visit: Payer: Self-pay | Admitting: Internal Medicine

## 2021-06-03 ENCOUNTER — Other Ambulatory Visit: Payer: Self-pay | Admitting: Internal Medicine

## 2021-06-04 ENCOUNTER — Other Ambulatory Visit: Payer: Self-pay | Admitting: Internal Medicine

## 2021-06-11 ENCOUNTER — Encounter: Payer: Self-pay | Admitting: Internal Medicine

## 2021-06-20 ENCOUNTER — Other Ambulatory Visit: Payer: Self-pay | Admitting: Internal Medicine

## 2021-06-20 MED ORDER — XIGDUO XR 10-1000 MG PO TB24: 1.0000 | ORAL_TABLET | Freq: Every day | ORAL | 11 refills | Status: DC

## 2021-07-06 ENCOUNTER — Other Ambulatory Visit (INDEPENDENT_AMBULATORY_CARE_PROVIDER_SITE_OTHER): Payer: Medicare Other

## 2021-07-06 ENCOUNTER — Other Ambulatory Visit: Payer: Self-pay

## 2021-07-06 DIAGNOSIS — I10 Essential (primary) hypertension: Secondary | ICD-10-CM

## 2021-07-06 DIAGNOSIS — E119 Type 2 diabetes mellitus without complications: Secondary | ICD-10-CM

## 2021-07-06 LAB — HEMOGLOBIN A1C: Hgb A1c MFr Bld: 8 % — ABNORMAL HIGH (ref 4.6–6.5)

## 2021-07-07 ENCOUNTER — Encounter: Payer: Self-pay | Admitting: Internal Medicine

## 2021-07-07 ENCOUNTER — Other Ambulatory Visit: Payer: Medicare Other

## 2021-07-07 LAB — BASIC METABOLIC PANEL
BUN: 21 mg/dL (ref 6–23)
CO2: 29 mEq/L (ref 19–32)
Calcium: 9.7 mg/dL (ref 8.4–10.5)
Chloride: 97 mEq/L (ref 96–112)
Creatinine, Ser: 0.89 mg/dL (ref 0.40–1.50)
GFR: 89.16 mL/min (ref >60.00)
Glucose, Bld: 163 mg/dL — ABNORMAL HIGH (ref 70–99)
Potassium: 4.5 mEq/L (ref 3.5–5.1)
Sodium: 132 mEq/L — ABNORMAL LOW (ref 135–145)

## 2021-07-11 ENCOUNTER — Other Ambulatory Visit: Payer: Self-pay

## 2021-07-11 ENCOUNTER — Ambulatory Visit (INDEPENDENT_AMBULATORY_CARE_PROVIDER_SITE_OTHER): Payer: Medicare Other | Admitting: Internal Medicine

## 2021-07-11 ENCOUNTER — Encounter: Payer: Self-pay | Admitting: Internal Medicine

## 2021-07-11 VITALS — BP 142/76 | HR 79 | Temp 98.9°F | Ht 70.0 in | Wt 248.1 lb

## 2021-07-11 DIAGNOSIS — I2510 Atherosclerotic heart disease of native coronary artery without angina pectoris: Secondary | ICD-10-CM

## 2021-07-11 DIAGNOSIS — E119 Type 2 diabetes mellitus without complications: Secondary | ICD-10-CM

## 2021-07-11 DIAGNOSIS — E781 Pure hyperglyceridemia: Secondary | ICD-10-CM

## 2021-07-11 DIAGNOSIS — I2583 Coronary atherosclerosis due to lipid rich plaque: Secondary | ICD-10-CM

## 2021-07-11 DIAGNOSIS — I10 Essential (primary) hypertension: Secondary | ICD-10-CM

## 2021-07-11 DIAGNOSIS — E785 Hyperlipidemia, unspecified: Secondary | ICD-10-CM

## 2021-07-11 MED ORDER — METFORMIN HCL 1000 MG PO TABS: 1000.0000 mg | ORAL_TABLET | Freq: Two times a day (BID) | ORAL | 3 refills | Status: DC

## 2021-07-11 MED ORDER — REPAGLINIDE 1 MG PO TABS: 1.0000 mg | ORAL_TABLET | Freq: Three times a day (TID) | ORAL | 3 refills | Status: DC

## 2021-07-11 MED ORDER — ATORVASTATIN CALCIUM 40 MG PO TABS: 40.0000 mg | ORAL_TABLET | Freq: Every day | ORAL | 2 refills | Status: DC

## 2021-07-11 NOTE — Assessment & Plan Note (Signed)
Cont on Irbesartan

## 2021-07-11 NOTE — Assessment & Plan Note (Signed)
Tricor is too $$$$ - use fish oil instead

## 2021-07-11 NOTE — Assessment & Plan Note (Signed)
Tricor is too $$$$ - use fish oil instead D/c Xigduo - $$$ Can't afford Ozemic Cont Metformin, start Prandin

## 2021-07-11 NOTE — Addendum Note (Signed)
Addended by: Tresa Garter on: 07/11/2021 09:09 AM   Modules accepted: Orders

## 2021-07-11 NOTE — Assessment & Plan Note (Signed)
On Atorvastatin 

## 2021-07-11 NOTE — Progress Notes (Addendum)
Subjective:  Patient ID: KACHE SEBESTYEN, male    DOB: [birth year]  Age: 66 y.o. MRN: [id number]  CC: Follow-up   HPI IBN BROSKY presents for DM, dyslipidemia/CAD, HTN   Rx are covered:  glipizide (any dose) nateglinide (any dose) pioglitazone (by itself, 45mg ) repaglinide (qid, ugh)  Ozempic is too $$$    Outpatient Medications Prior to Visit  Medication Sig Dispense Refill   aspirin 325 MG EC tablet Take 325 mg by mouth daily.     b complex vitamins tablet Take 1 tablet by mouth daily.     cholecalciferol (VITAMIN D) 1000 UNITS tablet Take 1,000 Units by mouth daily.     escitalopram (LEXAPRO) 10 MG tablet TAKE 1 TABLET BY MOUTH  DAILY 90 tablet 2   irbesartan (AVAPRO) 300 MG tablet TAKE 1 TABLET(300 MG) BY MOUTH DAILY 90 tablet 2   atorvastatin (LIPITOR) 40 MG tablet TAKE 1 TABLET BY MOUTH  DAILY 90 tablet 2   Dapagliflozin-metFORMIN HCl ER (XIGDUO XR) 02-999 MG TB24 Take 1 tablet by mouth daily. 30 tablet 11   fenofibrate (TRICOR) 145 MG tablet Take 1 tablet (145 mg total) by mouth daily. 90 tablet 2   buPROPion (WELLBUTRIN SR) 150 MG 12 hr tablet TAKE 1 TABLET BY MOUTH TWICE A DAY 180 tablet 3   No facility-administered medications prior to visit.    ROS: Review of Systems  Constitutional:  Negative for appetite change, fatigue and unexpected weight change.  HENT:  Negative for congestion, nosebleeds, sneezing, sore throat and trouble swallowing.   Eyes:  Negative for itching and visual disturbance.  Respiratory:  Negative for cough.   Cardiovascular:  Negative for chest pain, palpitations and leg swelling.  Gastrointestinal:  Negative for abdominal distention, blood in stool, diarrhea and nausea.  Genitourinary:  Negative for frequency and hematuria.  Musculoskeletal:  Positive for arthralgias and gait problem. Negative for back pain, joint swelling and neck pain.  Skin:  Negative for rash.  Neurological:  Negative for dizziness, tremors, speech  difficulty and weakness.  Psychiatric/Behavioral:  Negative for agitation, dysphoric mood and sleep disturbance. The patient is not nervous/anxious.    Objective:  BP (!) 142/76    Pulse 79    Temp 98.9 F (37.2 C) (Oral)    Ht 5\' 10"  (1.778 m)    Wt 248 lb 2 oz (112.5 kg)    SpO2 91%    BMI 35.60 kg/m   BP Readings from Last 3 Encounters:  07/11/21 (!) 142/76  03/09/21 138/86  06/23/20 (!) 142/84    Wt Readings from Last 3 Encounters:  07/11/21 248 lb 2 oz (112.5 kg)  03/09/21 253 lb 12.8 oz (115.1 kg)  06/23/20 248 lb 6.4 oz (112.7 kg)    Physical Exam Constitutional:      General: He is not in acute distress.    Appearance: He is well-developed.     Comments: NAD  Eyes:     Conjunctiva/sclera: Conjunctivae normal.     Pupils: Pupils are equal, round, and reactive to light.  Neck:     Thyroid: No thyromegaly.     Vascular: No JVD.  Cardiovascular:     Rate and Rhythm: Normal rate and regular rhythm.     Heart sounds: Normal heart sounds. No murmur heard.   No friction rub. No gallop.  Pulmonary:     Effort: Pulmonary effort is normal. No respiratory distress.     Breath sounds: Normal breath sounds. No wheezing or rales.  Chest:     Chest wall: No tenderness.  Abdominal:     General: Bowel sounds are normal. There is no distension.     Palpations: Abdomen is soft. There is no mass.     Tenderness: There is no abdominal tenderness. There is no guarding or rebound.  Musculoskeletal:        General: No tenderness. Normal range of motion.     Cervical back: Normal range of motion.  Lymphadenopathy:     Cervical: No cervical adenopathy.  Skin:    General: Skin is warm and dry.     Findings: No rash.  Neurological:     Mental Status: He is alert and oriented to person, place, and time.     Cranial Nerves: No cranial nerve deficit.     Motor: No abnormal muscle tone.     Coordination: Coordination normal.     Gait: Gait normal.     Deep Tendon Reflexes: Reflexes  are normal and symmetric.  Psychiatric:        Behavior: Behavior normal.        Thought Content: Thought content normal.        Judgment: Judgment normal.    Lab Results  Component Value Date   WBC 7.2 06/23/2020   HGB 15.4 06/23/2020   HCT 46.3 06/23/2020   PLT 257.0 06/23/2020   GLUCOSE 163 (H) 07/06/2021   CHOL 163 06/23/2020   TRIG (H) 06/23/2020    635.0 Triglyceride is over 400; calculations on Lipids are invalid.   HDL 40.00 06/23/2020   LDLDIRECT 53.0 06/23/2020   LDLCALC 58 12/09/2013   ALT 33 03/09/2021   AST 23 03/09/2021   NA 132 (L) 07/06/2021   K 4.5 07/06/2021   CL 97 07/06/2021   CREATININE 0.89 07/06/2021   BUN 21 07/06/2021   CO2 29 07/06/2021   TSH 1.65 06/23/2020   PSA 1.14 06/23/2020   INR 1.09 06/16/2010   HGBA1C 8.0 (H) 07/06/2021   MICROALBUR 1.3 07/17/2018    CT CARDIAC SCORING  Addendum Date: 01/09/2019   ADDENDUM REPORT: 01/09/2019 13:22 EXAM: OVER-READ INTERPRETATION  CT CHEST The following report is an over-read performed by radiologist Dr. Alvino Blood [facility] on 01/09/2019. This over-read does not include interpretation of cardiac or coronary anatomy or pathology. The coronary CTA interpretation by the cardiologist is attached. COMPARISON:  None. FINDINGS: Limited view of the lung parenchyma demonstrates small 6 mm nodule along the horizontal fissure. Similar nodule the oblique fissure in the RIGHT lung measuring 5 mm (image 9 and image 10 of series 3.) T which hird small nodule along the fissure abutting the pleural surface measures 6 mm also on image 17/3. Airways are normal. Limited view of the mediastinum demonstrates no adenopathy. Esophagus normal. Limited view of the upper abdomen unremarkable. Limited view of the skeleton and chest wall is unremarkable. IMPRESSION: Three small nodules in the RIGHT lung along the fissures. Fissural nodules are typically benign. No follow-up needed if patient is low-risk. Non-contrast  chest CT can be considered in 12 months if patient is high-risk. This recommendation follows the consensus statement: Guidelines for Management of Incidental Pulmonary Nodules Detected on CT Images: From the Fleischner Society 2017; Radiology 2017; 284:228-243. Electronically Signed   By: Suzy Bouchard M.D.   On: 01/09/2019 13:22   Result Date: 01/09/2019 CLINICAL DATA:  Risk stratification EXAM: Coronary Calcium Score MEDICATIONS: None TECHNIQUE: The patient was scanned on a [employer]. Axial non-contrast 3 mm slices  were carried out through the heart. The data set was analyzed on a dedicated work station and scored using the [place]. FINDINGS: Non-cardiac: See separate report from [facility]. Ascending Aorta: Normal size.  Minimal diffuse calcifications. Pericardium: Normal. Coronary arteries: Normal origin. IMPRESSION: Coronary calcium score of 760. This was 72 percentile for age and sex matched control. Electronically Signed: By: Ena Dawley On: 01/09/2019 12:14    Assessment & Plan:   Problem List Items Addressed This Visit     Coronary atherosclerosis    On Atorvastatin      Relevant Medications   atorvastatin (LIPITOR) 40 MG tablet   Diabetes mellitus, type 2 ([place]) - Primary    Tricor is too $$$$ - use fish oil instead D/c Xigduo - $$$ Can't afford Ozemic Cont Metformin, start Prandin      Relevant Medications   atorvastatin (LIPITOR) 40 MG tablet   metFORMIN (GLUCOPHAGE) 1000 MG tablet   repaglinide (PRANDIN) 1 MG tablet   Other Relevant Orders   Comprehensive metabolic panel   Hemoglobin A1c   Dyslipidemia    Tricor is too $$$$ - use fish oil instead On Crestor      Relevant Medications   atorvastatin (LIPITOR) 40 MG tablet   HYPERTENSION    Cont on Irbesartan      Relevant Medications   atorvastatin (LIPITOR) 40 MG tablet   Hypertriglyceridemia, essential    Tricor is too $$$$ - use fish oil instead      Relevant Medications    atorvastatin (LIPITOR) 40 MG tablet      Meds ordered this encounter  Medications   atorvastatin (LIPITOR) 40 MG tablet    Sig: Take 1 tablet (40 mg total) by mouth daily.    Dispense:  90 tablet    Refill:  2   metFORMIN (GLUCOPHAGE) 1000 MG tablet    Sig: Take 1 tablet (1,000 mg total) by mouth 2 (two) times daily with a meal.    Dispense:  180 tablet    Refill:  3   repaglinide (PRANDIN) 1 MG tablet    Sig: Take 1 tablet (1 mg total) by mouth 3 (three) times daily before meals.    Dispense:  270 tablet    Refill:  3      Follow-up: Return in about 3 months (around 10/08/2021) for a follow-up visit.  Walker Kehr, MD

## 2021-07-11 NOTE — Assessment & Plan Note (Signed)
Tricor is too $$$$ - use fish oil instead On Crestor

## 2021-07-22 ENCOUNTER — Encounter: Payer: Self-pay | Admitting: Internal Medicine

## 2021-08-10 LAB — COLOGUARD: COLOGUARD: NEGATIVE

## 2021-08-10 LAB — HM DIABETES EYE EXAM

## 2021-08-11 ENCOUNTER — Encounter: Payer: Self-pay | Admitting: Internal Medicine

## 2021-08-19 ENCOUNTER — Other Ambulatory Visit: Payer: Self-pay | Admitting: Internal Medicine

## 2021-08-31 ENCOUNTER — Other Ambulatory Visit: Payer: Self-pay | Admitting: *Deleted

## 2021-10-11 ENCOUNTER — Ambulatory Visit: Payer: Medicare Other | Admitting: Internal Medicine

## 2021-10-19 ENCOUNTER — Ambulatory Visit: Payer: Medicare Other | Admitting: Internal Medicine

## 2021-10-31 ENCOUNTER — Encounter: Payer: Self-pay | Admitting: Internal Medicine

## 2021-10-31 ENCOUNTER — Ambulatory Visit (INDEPENDENT_AMBULATORY_CARE_PROVIDER_SITE_OTHER): Payer: Medicare Other | Admitting: Internal Medicine

## 2021-10-31 DIAGNOSIS — E785 Hyperlipidemia, unspecified: Secondary | ICD-10-CM

## 2021-10-31 DIAGNOSIS — I10 Essential (primary) hypertension: Secondary | ICD-10-CM

## 2021-10-31 DIAGNOSIS — E119 Type 2 diabetes mellitus without complications: Secondary | ICD-10-CM

## 2021-10-31 DIAGNOSIS — R635 Abnormal weight gain: Secondary | ICD-10-CM

## 2021-10-31 DIAGNOSIS — Z6836 Body mass index (BMI) 36.0-36.9, adult: Secondary | ICD-10-CM

## 2021-10-31 LAB — COMPREHENSIVE METABOLIC PANEL
ALT: 35 U/L (ref 0–53)
AST: 23 U/L (ref 0–37)
Albumin: 4.4 g/dL (ref 3.5–5.2)
Alkaline Phosphatase: 66 U/L (ref 39–117)
BUN: 18 mg/dL (ref 6–23)
CO2: 25 mEq/L (ref 19–32)
Calcium: 9.8 mg/dL (ref 8.4–10.5)
Chloride: 99 mEq/L (ref 96–112)
Creatinine, Ser: 0.88 mg/dL (ref 0.40–1.50)
GFR: 89.27 mL/min (ref >60.00)
Glucose, Bld: 89 mg/dL (ref 70–99)
Potassium: 3.7 mEq/L (ref 3.5–5.1)
Sodium: 137 mEq/L (ref 135–145)
Total Bilirubin: 0.5 mg/dL (ref 0.2–1.2)
Total Protein: 7.3 g/dL (ref 6.0–8.3)

## 2021-10-31 LAB — HEMOGLOBIN A1C: Hgb A1c MFr Bld: 7.2 % — ABNORMAL HIGH (ref 4.6–6.5)

## 2021-10-31 NOTE — Assessment & Plan Note (Signed)
On Irbesartan 

## 2021-10-31 NOTE — Assessment & Plan Note (Signed)
Cont w/diet BMI 36

## 2021-10-31 NOTE — Progress Notes (Addendum)
Subjective:  Patient ID: KAILAND HARTFIELD, male    DOB: [birth year]  Age: 66 y.o. MRN: [id number]  CC: No chief complaint on file.   HPI TRAMON PICKING presents for wt gain, DM, HTN f/u  Outpatient Medications Prior to Visit  Medication Sig Dispense Refill   aspirin 325 MG EC tablet Take 325 mg by mouth daily.     atorvastatin (LIPITOR) 40 MG tablet Take 1 tablet (40 mg total) by mouth daily. 90 tablet 2   b complex vitamins tablet Take 1 tablet by mouth daily.     cholecalciferol (VITAMIN D) 1000 UNITS tablet Take 1,000 Units by mouth daily.     escitalopram (LEXAPRO) 10 MG tablet TAKE 1 TABLET BY MOUTH DAILY 90 tablet 2   irbesartan (AVAPRO) 300 MG tablet TAKE 1 TABLET(300 MG) BY MOUTH DAILY 90 tablet 2   metFORMIN (GLUCOPHAGE) 1000 MG tablet Take 1 tablet (1,000 mg total) by mouth 2 (two) times daily with a meal. 180 tablet 3   repaglinide (PRANDIN) 1 MG tablet Take 1 tablet (1 mg total) by mouth 3 (three) times daily before meals. 270 tablet 3   buPROPion (WELLBUTRIN SR) 150 MG 12 hr tablet TAKE 1 TABLET BY MOUTH TWICE A DAY 180 tablet 3   No facility-administered medications prior to visit.    ROS: Review of Systems  Constitutional:  Positive for unexpected weight change. Negative for appetite change and fatigue.  HENT:  Negative for congestion, nosebleeds, sneezing, sore throat and trouble swallowing.   Eyes:  Negative for itching and visual disturbance.  Respiratory:  Negative for cough.   Cardiovascular:  Negative for chest pain, palpitations and leg swelling.  Gastrointestinal:  Negative for abdominal distention, blood in stool, diarrhea and nausea.  Genitourinary:  Positive for decreased urine volume. Negative for frequency and hematuria.  Musculoskeletal:  Positive for arthralgias and gait problem. Negative for back pain, joint swelling and neck pain.  Skin:  Negative for rash.  Neurological:  Negative for dizziness, tremors, speech difficulty and weakness.   Psychiatric/Behavioral:  Negative for agitation, dysphoric mood, sleep disturbance and suicidal ideas. The patient is not nervous/anxious.     Objective:  BP (!) 160/100 (BP Location: Left Arm, Patient Position: Sitting, Cuff Size: Large)   Pulse 71   Temp 98.1 F (36.7 C) (Oral)   Ht 5\' 10"  (1.778 m)   Wt 256 lb (116.1 kg)   SpO2 94%   BMI 36.73 kg/m   BP Readings from Last 3 Encounters:  10/31/21 (!) 160/100  07/11/21 (!) 142/76  03/09/21 138/86    Wt Readings from Last 3 Encounters:  10/31/21 256 lb (116.1 kg)  07/11/21 248 lb 2 oz (112.5 kg)  03/09/21 253 lb 12.8 oz (115.1 kg)    Physical Exam Constitutional:      General: He is not in acute distress.    Appearance: He is well-developed. He is obese.     Comments: NAD  Eyes:     Conjunctiva/sclera: Conjunctivae normal.     Pupils: Pupils are equal, round, and reactive to light.  Neck:     Thyroid: No thyromegaly.     Vascular: No JVD.  Cardiovascular:     Rate and Rhythm: Normal rate and regular rhythm.     Heart sounds: Normal heart sounds. No murmur heard.    No friction rub. No gallop.  Pulmonary:     Effort: Pulmonary effort is normal. No respiratory distress.     Breath sounds: Normal breath  sounds. No wheezing or rales.  Chest:     Chest wall: No tenderness.  Abdominal:     General: Bowel sounds are normal. There is no distension.     Palpations: Abdomen is soft. There is no mass.     Tenderness: There is no abdominal tenderness. There is no guarding or rebound.  Musculoskeletal:        General: Tenderness present. Normal range of motion.     Cervical back: Normal range of motion.  Lymphadenopathy:     Cervical: No cervical adenopathy.  Skin:    General: Skin is warm and dry.     Findings: No rash.  Neurological:     Mental Status: He is alert and oriented to person, place, and time.     Cranial Nerves: No cranial nerve deficit.     Motor: No abnormal muscle tone.     Coordination:  Coordination normal.     Gait: Gait normal.     Deep Tendon Reflexes: Reflexes are normal and symmetric.  Psychiatric:        Behavior: Behavior normal.        Thought Content: Thought content normal.        Judgment: Judgment normal.   limping  Lab Results  Component Value Date   WBC 7.2 06/23/2020   HGB 15.4 06/23/2020   HCT 46.3 06/23/2020   PLT 257.0 06/23/2020   GLUCOSE 163 (H) 07/06/2021   CHOL 163 06/23/2020   TRIG (H) 06/23/2020    635.0 Triglyceride is over 400; calculations on Lipids are invalid.   HDL 40.00 06/23/2020   LDLDIRECT 53.0 06/23/2020   LDLCALC 58 12/09/2013   ALT 33 03/09/2021   AST 23 03/09/2021   NA 132 (L) 07/06/2021   K 4.5 07/06/2021   CL 97 07/06/2021   CREATININE 0.89 07/06/2021   BUN 21 07/06/2021   CO2 29 07/06/2021   TSH 1.65 06/23/2020   PSA 1.14 06/23/2020   INR 1.09 06/16/2010   HGBA1C 8.0 (H) 07/06/2021   MICROALBUR 1.3 07/17/2018    CT CARDIAC SCORING  Addendum Date: 01/09/2019   ADDENDUM REPORT: 01/09/2019 13:22 EXAM: OVER-READ INTERPRETATION  CT CHEST The following report is an over-read performed by radiologist Dr. Alvino Blood [facility] on 01/09/2019. This over-read does not include interpretation of cardiac or coronary anatomy or pathology. The coronary CTA interpretation by the cardiologist is attached. COMPARISON:  None. FINDINGS: Limited view of the lung parenchyma demonstrates small 6 mm nodule along the horizontal fissure. Similar nodule the oblique fissure in the RIGHT lung measuring 5 mm (image 9 and image 10 of series 3.) T which hird small nodule along the fissure abutting the pleural surface measures 6 mm also on image 17/3. Airways are normal. Limited view of the mediastinum demonstrates no adenopathy. Esophagus normal. Limited view of the upper abdomen unremarkable. Limited view of the skeleton and chest wall is unremarkable. IMPRESSION: Three small nodules in the RIGHT lung along the fissures. Fissural  nodules are typically benign. No follow-up needed if patient is low-risk. Non-contrast chest CT can be considered in 12 months if patient is high-risk. This recommendation follows the consensus statement: Guidelines for Management of Incidental Pulmonary Nodules Detected on CT Images: From the Fleischner Society 2017; Radiology 2017; 284:228-243. Electronically Signed   By: Suzy Bouchard M.D.   On: 01/09/2019 13:22   Result Date: 01/09/2019 CLINICAL DATA:  Risk stratification EXAM: Coronary Calcium Score MEDICATIONS: None TECHNIQUE: The patient was scanned on a [employer] scanner. Axial non-contrast 3 mm slices were carried out through the heart. The data set was analyzed on a dedicated work station and scored using the [place]. FINDINGS: Non-cardiac: See separate report from [facility]. Ascending Aorta: Normal size.  Minimal diffuse calcifications. Pericardium: Normal. Coronary arteries: Normal origin. IMPRESSION: Coronary calcium score of 760. This was 74 percentile for age and sex matched control. Electronically Signed: By: Ena Dawley On: 01/09/2019 12:14    Assessment & Plan:   Problem List Items Addressed This Visit     Diabetes mellitus, type 2 ([place])    Ozemic      Dyslipidemia    Cont on Crestor      HYPERTENSION    On Irbesartan      Obesity    Cont w/diet BMI 36      Weight gain    Will try Ozemic if not too $$$         No orders of the defined types were placed in this encounter.     Follow-up: Return in about 3 months (around 01/31/2022) for a follow-up visit.  Walker Kehr, MD

## 2021-10-31 NOTE — Assessment & Plan Note (Signed)
Cont on Crestor 

## 2021-10-31 NOTE — Assessment & Plan Note (Signed)
Will try Ozemic if not too $$$

## 2021-10-31 NOTE — Assessment & Plan Note (Signed)
Ozemic

## 2021-11-01 ENCOUNTER — Encounter: Payer: Self-pay | Admitting: Internal Medicine

## 2021-11-02 ENCOUNTER — Other Ambulatory Visit: Payer: Self-pay | Admitting: Internal Medicine

## 2021-11-02 MED ORDER — RYBELSUS 3 MG PO TABS: 3.0000 mg | ORAL_TABLET | Freq: Every day | ORAL | 1 refills | Status: DC

## 2021-11-13 ENCOUNTER — Encounter: Payer: Self-pay | Admitting: Internal Medicine

## 2021-12-12 ENCOUNTER — Encounter: Payer: Self-pay | Admitting: Internal Medicine

## 2022-01-18 ENCOUNTER — Encounter: Payer: Self-pay | Admitting: Internal Medicine

## 2022-01-18 MED ORDER — RYBELSUS 7 MG PO TABS: 7.0000 mg | ORAL_TABLET | Freq: Every day | ORAL | 1 refills | Status: DC

## 2022-02-01 ENCOUNTER — Ambulatory Visit: Payer: Medicare Other | Admitting: Internal Medicine

## 2022-02-07 ENCOUNTER — Other Ambulatory Visit: Payer: Self-pay | Admitting: Internal Medicine

## 2022-03-20 ENCOUNTER — Ambulatory Visit: Payer: Medicare Other | Admitting: Internal Medicine

## 2022-04-12 ENCOUNTER — Ambulatory Visit (INDEPENDENT_AMBULATORY_CARE_PROVIDER_SITE_OTHER): Payer: Medicare Other | Admitting: Internal Medicine

## 2022-04-12 ENCOUNTER — Other Ambulatory Visit (INDEPENDENT_AMBULATORY_CARE_PROVIDER_SITE_OTHER): Payer: Medicare Other

## 2022-04-12 ENCOUNTER — Encounter: Payer: Self-pay | Admitting: Internal Medicine

## 2022-04-12 VITALS — BP 162/72 | HR 75 | Temp 99.4°F | Ht 70.0 in | Wt 251.8 lb

## 2022-04-12 DIAGNOSIS — E119 Type 2 diabetes mellitus without complications: Secondary | ICD-10-CM | POA: Diagnosis not present

## 2022-04-12 DIAGNOSIS — E785 Hyperlipidemia, unspecified: Secondary | ICD-10-CM

## 2022-04-12 DIAGNOSIS — I10 Essential (primary) hypertension: Secondary | ICD-10-CM

## 2022-04-12 DIAGNOSIS — N320 Bladder-neck obstruction: Secondary | ICD-10-CM

## 2022-04-12 DIAGNOSIS — R635 Abnormal weight gain: Secondary | ICD-10-CM

## 2022-04-12 DIAGNOSIS — F32A Depression, unspecified: Secondary | ICD-10-CM | POA: Diagnosis not present

## 2022-04-12 DIAGNOSIS — E66812 Obesity, class 2: Secondary | ICD-10-CM

## 2022-04-12 DIAGNOSIS — Z6836 Body mass index (BMI) 36.0-36.9, adult: Secondary | ICD-10-CM

## 2022-04-12 LAB — BASIC METABOLIC PANEL
BUN: 12 mg/dL (ref 6–23)
CO2: 27 mEq/L (ref 19–32)
Calcium: 9.4 mg/dL (ref 8.4–10.5)
Chloride: 101 mEq/L (ref 96–112)
Creatinine, Ser: 0.73 mg/dL (ref 0.40–1.50)
GFR: 94.15 mL/min (ref >60.00)
Glucose, Bld: 109 mg/dL — ABNORMAL HIGH (ref 70–99)
Potassium: 4.1 mEq/L (ref 3.5–5.1)
Sodium: 136 mEq/L (ref 135–145)

## 2022-04-12 LAB — HEMOGLOBIN A1C: Hgb A1c MFr Bld: 8.2 % — ABNORMAL HIGH (ref 4.6–6.5)

## 2022-04-12 MED ORDER — IRBESARTAN-HYDROCHLOROTHIAZIDE 150-12.5 MG PO TABS: 2.0000 | ORAL_TABLET | Freq: Every day | ORAL | 3 refills | Status: DC

## 2022-04-12 NOTE — Assessment & Plan Note (Signed)
On Irbesartan - not well controlled Change to Irbesartan HCT

## 2022-04-12 NOTE — Assessment & Plan Note (Signed)
Cont on Crestor 

## 2022-04-12 NOTE — Assessment & Plan Note (Signed)
Rybelsus was too $$$ - pt stopped in 1 month

## 2022-04-12 NOTE — Assessment & Plan Note (Signed)
Chronic  On Wellbutrin, Lexapro

## 2022-04-12 NOTE — Assessment & Plan Note (Signed)
  On diet  

## 2022-04-12 NOTE — Patient Instructions (Signed)
We may re-start Glixambi if A1c is up

## 2022-04-12 NOTE — Progress Notes (Signed)
Subjective:  Patient ID: Roberto Swanson, male    DOB: [birth year]  Age: 66 y.o. MRN: [id number]  CC: Follow-up (3 month f/u)   HPI JONPAUL REGEHR presents for DM, HTN, obesity Rybelsus was too $$$ - pt stopped in 1 month  Outpatient Medications Prior to Visit  Medication Sig Dispense Refill   aspirin 325 MG EC tablet Take 325 mg by mouth daily.     atorvastatin (LIPITOR) 40 MG tablet TAKE 1 TABLET(40 MG) BY MOUTH DAILY 90 tablet 2   b complex vitamins tablet Take 1 tablet by mouth daily.     cholecalciferol (VITAMIN D) 1000 UNITS tablet Take 1,000 Units by mouth daily.     escitalopram (LEXAPRO) 10 MG tablet TAKE 1 TABLET BY MOUTH DAILY 90 tablet 2   metFORMIN (GLUCOPHAGE) 1000 MG tablet Take 1 tablet (1,000 mg total) by mouth 2 (two) times daily with a meal. 180 tablet 3   repaglinide (PRANDIN) 1 MG tablet Take 1 tablet (1 mg total) by mouth 3 (three) times daily before meals. 270 tablet 3   irbesartan (AVAPRO) 300 MG tablet TAKE 1 TABLET(300 MG) BY MOUTH DAILY 90 tablet 2   Semaglutide (RYBELSUS) 7 MG TABS Take 7 mg by mouth daily. 90 tablet 1   No facility-administered medications prior to visit.    ROS: Review of Systems  Constitutional:  Negative for appetite change, fatigue and unexpected weight change.  HENT:  Negative for congestion, nosebleeds, sneezing, sore throat and trouble swallowing.   Eyes:  Negative for itching and visual disturbance.  Respiratory:  Negative for cough.   Cardiovascular:  Negative for chest pain, palpitations and leg swelling.  Gastrointestinal:  Negative for abdominal distention, blood in stool, diarrhea and nausea.  Genitourinary:  Negative for frequency and hematuria.  Musculoskeletal:  Negative for back pain, gait problem, joint swelling and neck pain.  Skin:  Negative for rash.  Neurological:  Negative for dizziness, tremors, speech difficulty and weakness.  Psychiatric/Behavioral:  Negative for agitation, dysphoric mood and sleep  disturbance. The patient is not nervous/anxious.     Objective:  BP (!) 162/72 (BP Location: Left Arm)   Pulse 75   Temp 99.4 F (37.4 C) (Oral)   Ht 5\' 10"  (1.778 m)   Wt 251 lb 12.8 oz (114.2 kg)   SpO2 95%   BMI 36.13 kg/m   BP Readings from Last 3 Encounters:  04/12/22 (!) 162/72  10/31/21 (!) 160/100  07/11/21 (!) 142/76    Wt Readings from Last 3 Encounters:  04/12/22 251 lb 12.8 oz (114.2 kg)  10/31/21 256 lb (116.1 kg)  07/11/21 248 lb 2 oz (112.5 kg)    Physical Exam Constitutional:      General: He is not in acute distress.    Appearance: He is well-developed.     Comments: NAD  Eyes:     Conjunctiva/sclera: Conjunctivae normal.     Pupils: Pupils are equal, round, and reactive to light.  Neck:     Thyroid: No thyromegaly.     Vascular: No JVD.  Cardiovascular:     Rate and Rhythm: Normal rate and regular rhythm.     Heart sounds: Normal heart sounds. No murmur heard.    No friction rub. No gallop.  Pulmonary:     Effort: Pulmonary effort is normal. No respiratory distress.     Breath sounds: Normal breath sounds. No wheezing or rales.  Chest:     Chest wall: No tenderness.  Abdominal:  General: Bowel sounds are normal. There is no distension.     Palpations: Abdomen is soft. There is no mass.     Tenderness: There is no abdominal tenderness. There is no guarding or rebound.  Musculoskeletal:        General: No tenderness. Normal range of motion.     Cervical back: Normal range of motion.  Lymphadenopathy:     Cervical: No cervical adenopathy.  Skin:    General: Skin is warm and dry.     Findings: No rash.  Neurological:     Mental Status: He is alert and oriented to person, place, and time.     Cranial Nerves: No cranial nerve deficit.     Motor: No abnormal muscle tone.     Coordination: Coordination normal.     Gait: Gait normal.     Deep Tendon Reflexes: Reflexes are normal and symmetric.  Psychiatric:        Behavior: Behavior  normal.        Thought Content: Thought content normal.        Judgment: Judgment normal.     Lab Results  Component Value Date   WBC 7.2 06/23/2020   HGB 15.4 06/23/2020   HCT 46.3 06/23/2020   PLT 257.0 06/23/2020   GLUCOSE 89 10/31/2021   CHOL 163 06/23/2020   TRIG (H) 06/23/2020    635.0 Triglyceride is over 400; calculations on Lipids are invalid.   HDL 40.00 06/23/2020   LDLDIRECT 53.0 06/23/2020   LDLCALC 58 12/09/2013   ALT 35 10/31/2021   AST 23 10/31/2021   NA 137 10/31/2021   K 3.7 10/31/2021   CL 99 10/31/2021   CREATININE 0.88 10/31/2021   BUN 18 10/31/2021   CO2 25 10/31/2021   TSH 1.65 06/23/2020   PSA 1.14 06/23/2020   INR 1.09 06/16/2010   HGBA1C 7.2 (H) 10/31/2021   MICROALBUR 1.3 07/17/2018    CT CARDIAC SCORING  Addendum Date: 01/09/2019   ADDENDUM REPORT: 01/09/2019 13:22 EXAM: OVER-READ INTERPRETATION  CT CHEST The following report is an over-read performed by radiologist Dr. Maryelizabeth Rowan [facility] on 01/09/2019. This over-read does not include interpretation of cardiac or coronary anatomy or pathology. The coronary CTA interpretation by the cardiologist is attached. COMPARISON:  None. FINDINGS: Limited view of the lung parenchyma demonstrates small 6 mm nodule along the horizontal fissure. Similar nodule the oblique fissure in the RIGHT lung measuring 5 mm (image 9 and image 10 of series 3.) T which hird small nodule along the fissure abutting the pleural surface measures 6 mm also on image 17/3. Airways are normal. Limited view of the mediastinum demonstrates no adenopathy. Esophagus normal. Limited view of the upper abdomen unremarkable. Limited view of the skeleton and chest wall is unremarkable. IMPRESSION: Three small nodules in the RIGHT lung along the fissures. Fissural nodules are typically benign. No follow-up needed if patient is low-risk. Non-contrast chest CT can be considered in 12 months if patient is high-risk. This  recommendation follows the consensus statement: Guidelines for Management of Incidental Pulmonary Nodules Detected on CT Images: From the Fleischner Society 2017; Radiology 2017; 284:228-243. Electronically Signed   By: Genevive Bi M.D.   On: 01/09/2019 13:22   Result Date: 01/09/2019 CLINICAL DATA:  Risk stratification EXAM: Coronary Calcium Score MEDICATIONS: None TECHNIQUE: The patient was scanned on a [employer]. Axial non-contrast 3 mm slices were carried out through the heart. The data set was analyzed on a dedicated work station  and scored using the [place] method. FINDINGS: Non-cardiac: See separate report from [facility]. Ascending Aorta: Normal size.  Minimal diffuse calcifications. Pericardium: Normal. Coronary arteries: Normal origin. IMPRESSION: Coronary calcium score of 760. This was 80 percentile for age and sex matched control. Electronically Signed: By: Ena Dawley On: 01/09/2019 12:14    Assessment & Plan:   Problem List Items Addressed This Visit     Weight gain - Primary    Rybelsus was too $$$ - pt stopped in 1 month      Relevant Orders   TSH   Urinalysis   CBC with Differential/Platelet   Lipid panel   PSA   Comprehensive metabolic panel   Hemoglobin A1c   Obesity    On diet      HYPERTENSION    On Irbesartan - not well controlled Change to Irbesartan HCT      Relevant Medications   irbesartan-hydrochlorothiazide (AVALIDE) 150-12.5 MG tablet   Dyslipidemia    Cont on Crestor      Relevant Orders   TSH   Urinalysis   CBC with Differential/Platelet   Lipid panel   PSA   Comprehensive metabolic panel   Hemoglobin A1c   Diabetes mellitus, type 2 (HCC)    Rybelsus was too $$$ - pt stopped in 1 month We may re-start Glixambi if A1c is up      Relevant Medications   irbesartan-hydrochlorothiazide (AVALIDE) 150-12.5 MG tablet   Other Relevant Orders   TSH   Urinalysis   CBC with Differential/Platelet   Lipid panel    PSA   Comprehensive metabolic panel   Hemoglobin A1c   Chronic depression    Chronic  On Wellbutrin, Lexapro      Other Visit Diagnoses     Bladder neck obstruction       Relevant Orders   PSA         Meds ordered this encounter  Medications   irbesartan-hydrochlorothiazide (AVALIDE) 150-12.5 MG tablet    Sig: Take 2 tablets by mouth daily.    Dispense:  180 tablet    Refill:  3      Follow-up: Return in about 3 months (around 07/13/2022) for a follow-up visit.  Walker Kehr, MD

## 2022-04-12 NOTE — Assessment & Plan Note (Addendum)
Rybelsus was too $$$ - pt stopped in 1 month We may re-start Glixambi if A1c is up

## 2022-04-16 ENCOUNTER — Other Ambulatory Visit: Payer: Self-pay | Admitting: Internal Medicine

## 2022-04-17 ENCOUNTER — Encounter: Payer: Self-pay | Admitting: Internal Medicine

## 2022-04-26 ENCOUNTER — Encounter: Payer: Self-pay | Admitting: Internal Medicine

## 2022-05-03 ENCOUNTER — Other Ambulatory Visit: Payer: Self-pay | Admitting: Internal Medicine

## 2022-05-03 MED ORDER — RYBELSUS 7 MG PO TABS: 1.0000 | ORAL_TABLET | Freq: Every day | ORAL | 3 refills | Status: DC

## 2022-05-12 ENCOUNTER — Other Ambulatory Visit: Payer: Self-pay | Admitting: Internal Medicine

## 2022-05-24 ENCOUNTER — Other Ambulatory Visit: Payer: Self-pay | Admitting: Internal Medicine

## 2022-05-31 ENCOUNTER — Encounter: Payer: Self-pay | Admitting: Internal Medicine

## 2022-07-06 ENCOUNTER — Encounter: Payer: Self-pay | Admitting: Internal Medicine

## 2022-07-09 ENCOUNTER — Other Ambulatory Visit: Payer: Self-pay | Admitting: Internal Medicine

## 2022-07-09 MED ORDER — RYBELSUS 14 MG PO TABS: 14.0000 mg | ORAL_TABLET | Freq: Every day | ORAL | 5 refills | Status: DC

## 2022-08-13 ENCOUNTER — Other Ambulatory Visit: Payer: Self-pay | Admitting: Internal Medicine

## 2022-08-14 ENCOUNTER — Encounter: Payer: Self-pay | Admitting: Internal Medicine

## 2022-08-21 ENCOUNTER — Encounter: Payer: Self-pay | Admitting: Internal Medicine

## 2022-08-21 ENCOUNTER — Other Ambulatory Visit: Payer: Self-pay | Admitting: Internal Medicine

## 2022-08-21 ENCOUNTER — Ambulatory Visit (INDEPENDENT_AMBULATORY_CARE_PROVIDER_SITE_OTHER): Payer: Medicare Other | Admitting: Internal Medicine

## 2022-08-21 VITALS — BP 118/80 | HR 77 | Temp 98.7°F | Ht 70.0 in | Wt 251.0 lb

## 2022-08-21 DIAGNOSIS — E785 Hyperlipidemia, unspecified: Secondary | ICD-10-CM | POA: Diagnosis not present

## 2022-08-21 DIAGNOSIS — E119 Type 2 diabetes mellitus without complications: Secondary | ICD-10-CM

## 2022-08-21 DIAGNOSIS — E781 Pure hyperglyceridemia: Secondary | ICD-10-CM

## 2022-08-21 DIAGNOSIS — I2510 Atherosclerotic heart disease of native coronary artery without angina pectoris: Secondary | ICD-10-CM

## 2022-08-21 DIAGNOSIS — I2583 Coronary atherosclerosis due to lipid rich plaque: Secondary | ICD-10-CM

## 2022-08-21 DIAGNOSIS — F4321 Adjustment disorder with depressed mood: Secondary | ICD-10-CM

## 2022-08-21 DIAGNOSIS — Z1211 Encounter for screening for malignant neoplasm of colon: Secondary | ICD-10-CM

## 2022-08-21 DIAGNOSIS — Z6836 Body mass index (BMI) 36.0-36.9, adult: Secondary | ICD-10-CM

## 2022-08-21 LAB — LIPID PANEL
Cholesterol: 145 mg/dL (ref 0–200)
HDL: 42.4 mg/dL (ref >39.00)
NonHDL: 102.65
Total CHOL/HDL Ratio: 3
Triglycerides: 382 mg/dL — ABNORMAL HIGH (ref 0.0–149.0)
VLDL: 76.4 mg/dL — ABNORMAL HIGH (ref 0.0–40.0)

## 2022-08-21 LAB — COMPREHENSIVE METABOLIC PANEL WITH GFR
ALT: 40 U/L (ref 0–53)
AST: 24 U/L (ref 0–37)
Albumin: 4.5 g/dL (ref 3.5–5.2)
Alkaline Phosphatase: 74 U/L (ref 39–117)
BUN: 20 mg/dL (ref 6–23)
CO2: 26 meq/L (ref 19–32)
Calcium: 9.8 mg/dL (ref 8.4–10.5)
Chloride: 101 meq/L (ref 96–112)
Creatinine, Ser: 0.89 mg/dL (ref 0.40–1.50)
GFR: 88.46 mL/min
Glucose, Bld: 175 mg/dL — ABNORMAL HIGH (ref 70–99)
Potassium: 4.1 meq/L (ref 3.5–5.1)
Sodium: 134 meq/L — ABNORMAL LOW (ref 135–145)
Total Bilirubin: 0.4 mg/dL (ref 0.2–1.2)
Total Protein: 7.4 g/dL (ref 6.0–8.3)

## 2022-08-21 LAB — HEMOGLOBIN A1C: Hgb A1c MFr Bld: 8.9 % — ABNORMAL HIGH (ref 4.6–6.5)

## 2022-08-21 LAB — MICROALBUMIN / CREATININE URINE RATIO
Creatinine,U: 118.4 mg/dL
Microalb Creat Ratio: 3.7 mg/g (ref 0.0–30.0)
Microalb, Ur: 4.4 mg/dL — ABNORMAL HIGH (ref 0.0–1.9)

## 2022-08-21 LAB — LDL CHOLESTEROL, DIRECT: Direct LDL: 62 mg/dL

## 2022-08-21 MED ORDER — METFORMIN HCL 1000 MG PO TABS: 1000.0000 mg | ORAL_TABLET | Freq: Two times a day (BID) | ORAL | 2 refills | Status: DC

## 2022-08-21 NOTE — Assessment & Plan Note (Signed)
Cont Metformin, Prandin Ozemic or Darcel Bayley is discussed

## 2022-08-21 NOTE — Progress Notes (Signed)
Subjective:  Patient ID: IBRAHIM KNIPE, male    DOB: Jul 22, 1955  Age: 67 y.o. MRN: [id number]  CC: No chief complaint on file.   HPI TYRIC CIRELLO presents for DM, HTN, CAD  Outpatient Medications Prior to Visit  Medication Sig Dispense Refill   aspirin 325 MG EC tablet Take 325 mg by mouth daily.     atorvastatin (LIPITOR) 40 MG tablet TAKE 1 TABLET(40 MG) BY MOUTH DAILY 90 tablet 2   b complex vitamins tablet Take 1 tablet by mouth daily.     cholecalciferol (VITAMIN D) 1000 UNITS tablet Take 1,000 Units by mouth daily.     escitalopram (LEXAPRO) 10 MG tablet TAKE 1 TABLET BY MOUTH DAILY 90 tablet 2   irbesartan (AVAPRO) 300 MG tablet TAKE 1 TABLET(300 MG) BY MOUTH DAILY 90 tablet 2   metFORMIN (GLUCOPHAGE) 1000 MG tablet Take 1 tablet (1,000 mg total) by mouth 2 (two) times daily. 180 tablet 2   repaglinide (PRANDIN) 1 MG tablet Take 1 tablet (1 mg total) by mouth 3 (three) times daily before meals. 270 tablet 3   Semaglutide (RYBELSUS) 14 MG TABS Take 1 tablet (14 mg total) by mouth daily. 30 tablet 5   irbesartan-hydrochlorothiazide (AVALIDE) 150-12.5 MG tablet Take 2 tablets by mouth daily. 180 tablet 3   No facility-administered medications prior to visit.    ROS: Review of Systems  Constitutional:  Negative for appetite change, fatigue and unexpected weight change.  HENT:  Negative for congestion, nosebleeds, sneezing, sore throat and trouble swallowing.   Eyes:  Negative for itching and visual disturbance.  Respiratory:  Negative for cough.   Cardiovascular:  Negative for chest pain, palpitations and leg swelling.  Gastrointestinal:  Negative for abdominal distention, blood in stool, diarrhea and nausea.  Genitourinary:  Negative for frequency and hematuria.  Musculoskeletal:  Negative for back pain, gait problem, joint swelling and neck pain.  Skin:  Negative for rash.  Neurological:  Negative for dizziness, tremors, speech difficulty and weakness.   Psychiatric/Behavioral:  Negative for agitation, dysphoric mood and sleep disturbance. The patient is not nervous/anxious.     Objective:  BP 118/80 (BP Location: Right Arm, Patient Position: Sitting, Cuff Size: Large)   Pulse 77   Temp 98.7 F (37.1 C) (Oral)   Ht 5\' 10"  (1.778 m)   Wt 251 lb (113.9 kg)   SpO2 97%   BMI 36.01 kg/m   BP Readings from Last 3 Encounters:  08/21/22 118/80  04/12/22 (!) 162/72  10/31/21 (!) 160/100    Wt Readings from Last 3 Encounters:  08/21/22 251 lb (113.9 kg)  04/12/22 251 lb 12.8 oz (114.2 kg)  10/31/21 256 lb (116.1 kg)    Physical Exam Constitutional:      General: He is not in acute distress.    Appearance: He is well-developed. He is obese.     Comments: NAD  Eyes:     Conjunctiva/sclera: Conjunctivae normal.     Pupils: Pupils are equal, round, and reactive to light.  Neck:     Thyroid: No thyromegaly.     Vascular: No JVD.  Cardiovascular:     Rate and Rhythm: Normal rate and regular rhythm.     Heart sounds: Normal heart sounds. No murmur heard.    No friction rub. No gallop.  Pulmonary:     Effort: Pulmonary effort is normal. No respiratory distress.     Breath sounds: Normal breath sounds. No wheezing or rales.  Chest:  Chest wall: No tenderness.  Abdominal:     General: Bowel sounds are normal. There is no distension.     Palpations: Abdomen is soft. There is no mass.     Tenderness: There is no abdominal tenderness. There is no guarding or rebound.  Musculoskeletal:        General: No tenderness. Normal range of motion.     Cervical back: Normal range of motion.  Lymphadenopathy:     Cervical: No cervical adenopathy.  Skin:    General: Skin is warm and dry.     Findings: No rash.  Neurological:     Mental Status: He is alert and oriented to person, place, and time.     Cranial Nerves: No cranial nerve deficit.     Motor: No abnormal muscle tone.     Coordination: Coordination normal.     Gait: Gait  normal.     Deep Tendon Reflexes: Reflexes are normal and symmetric.  Psychiatric:        Behavior: Behavior normal.        Thought Content: Thought content normal.        Judgment: Judgment normal.     Lab Results  Component Value Date   WBC 7.2 06/23/2020   HGB 15.4 06/23/2020   HCT 46.3 06/23/2020   PLT 257.0 06/23/2020   GLUCOSE 109 (H) 04/12/2022   CHOL 163 06/23/2020   TRIG (H) 06/23/2020    635.0 Triglyceride is over 400; calculations on Lipids are invalid.   HDL 40.00 06/23/2020   LDLDIRECT 53.0 06/23/2020   LDLCALC 58 12/09/2013   ALT 35 10/31/2021   AST 23 10/31/2021   NA 136 04/12/2022   K 4.1 04/12/2022   CL 101 04/12/2022   CREATININE 0.73 04/12/2022   BUN 12 04/12/2022   CO2 27 04/12/2022   TSH 1.65 06/23/2020   PSA 1.14 06/23/2020   INR 1.09 06/16/2010   HGBA1C 8.2 (H) 04/12/2022   MICROALBUR 1.3 07/17/2018    CT CARDIAC SCORING  Addendum Date: 01/09/2019   ADDENDUM REPORT: 01/09/2019 13:22 EXAM: OVER-READ INTERPRETATION  CT CHEST The following report is an over-read performed by radiologist Dr. Alvino Blood [facility] on 01/09/2019. This over-read does not include interpretation of cardiac or coronary anatomy or pathology. The coronary CTA interpretation by the cardiologist is attached. COMPARISON:  None. FINDINGS: Limited view of the lung parenchyma demonstrates small 6 mm nodule along the horizontal fissure. Similar nodule the oblique fissure in the RIGHT lung measuring 5 mm (image 9 and image 10 of series 3.) T which hird small nodule along the fissure abutting the pleural surface measures 6 mm also on image 17/3. Airways are normal. Limited view of the mediastinum demonstrates no adenopathy. Esophagus normal. Limited view of the upper abdomen unremarkable. Limited view of the skeleton and chest wall is unremarkable. IMPRESSION: Three small nodules in the RIGHT lung along the fissures. Fissural nodules are typically benign. No follow-up  needed if patient is low-risk. Non-contrast chest CT can be considered in 12 months if patient is high-risk. This recommendation follows the consensus statement: Guidelines for Management of Incidental Pulmonary Nodules Detected on CT Images: From the Fleischner Society 2017; Radiology 2017; 284:228-243. Electronically Signed   By: Suzy Bouchard M.D.   On: 01/09/2019 13:22   Result Date: 01/09/2019 CLINICAL DATA:  Risk stratification EXAM: Coronary Calcium Score MEDICATIONS: None TECHNIQUE: The patient was scanned on a [employer]. Axial non-contrast 3 mm slices were carried out through the  heart. The data set was analyzed on a dedicated work station and scored using the [place]. FINDINGS: Non-cardiac: See separate report from [facility]. Ascending Aorta: Normal size.  Minimal diffuse calcifications. Pericardium: Normal. Coronary arteries: Normal origin. IMPRESSION: Coronary calcium score of 760. This was 64 percentile for age and sex matched control. Electronically Signed: By: Ena Dawley On: 01/09/2019 12:14    Assessment & Plan:   Problem List Items Addressed This Visit       Cardiovascular and Mediastinum   Coronary atherosclerosis    On Atorvastatin        Endocrine   Diabetes mellitus, type 2 - Primary    Cont Metformin, Prandin Ozemic or Mounjaro is discussed       Relevant Orders   Lipid panel   Microalbumin / creatinine urine ratio   Comprehensive metabolic panel   Hemoglobin A1c   Urine microalbumin-creatinine with uACR     Other   Obesity    Wt Readings from Last 3 Encounters:  08/21/22 251 lb (113.9 kg)  04/12/22 251 lb 12.8 oz (114.2 kg)  10/31/21 256 lb (116.1 kg)        Hypertriglyceridemia, essential     On Lipitor, fenofibrate.      Grief    08/31/22 Father died at 67  Izora Gala)  Discussed      Dyslipidemia    Cont on Crestor      Relevant Orders   Lipid panel   Other Visit Diagnoses     Colon cancer screening        Relevant Orders   Ambulatory referral to Gastroenterology         No orders of the defined types were placed in this encounter.     Follow-up: Return in about 3 months (around 11/20/2022) for a follow-up visit.  Walker Kehr, MD

## 2022-08-21 NOTE — Assessment & Plan Note (Signed)
09/26/22 Father died at 67  Izora Gala)  Discussed

## 2022-08-21 NOTE — Assessment & Plan Note (Addendum)
On Lipitor, fenofibrate.

## 2022-08-21 NOTE — Assessment & Plan Note (Signed)
Cont on Crestor 

## 2022-08-21 NOTE — Assessment & Plan Note (Signed)
On Atorvastatin 

## 2022-08-21 NOTE — Assessment & Plan Note (Signed)
Wt Readings from Last 3 Encounters:  08/21/22 251 lb (113.9 kg)  04/12/22 251 lb 12.8 oz (114.2 kg)  10/31/21 256 lb (116.1 kg)

## 2022-08-21 NOTE — Patient Instructions (Signed)
Ozemic or [employer]

## 2022-08-22 ENCOUNTER — Encounter: Payer: Self-pay | Admitting: Internal Medicine

## 2022-08-25 ENCOUNTER — Other Ambulatory Visit: Payer: Self-pay | Admitting: Internal Medicine

## 2022-08-25 MED ORDER — TIRZEPATIDE 2.5 MG/0.5ML ~~LOC~~ SOAJ
2.5000 mg | SUBCUTANEOUS | 1 refills | Status: DC
Start: 2022-08-25 — End: 2022-09-27

## 2022-09-19 ENCOUNTER — Telehealth: Payer: Self-pay | Admitting: Radiology

## 2022-09-19 ENCOUNTER — Encounter: Payer: Self-pay | Admitting: Gastroenterology

## 2022-09-19 NOTE — Telephone Encounter (Signed)
LVM for Pt to call back to make AWV appointment.

## 2022-09-23 ENCOUNTER — Encounter: Payer: Self-pay | Admitting: Internal Medicine

## 2022-09-27 ENCOUNTER — Other Ambulatory Visit: Payer: Self-pay | Admitting: Internal Medicine

## 2022-09-27 MED ORDER — TIRZEPATIDE 5 MG/0.5ML ~~LOC~~ SOAJ
5.0000 mg | SUBCUTANEOUS | 2 refills | Status: DC
Start: 2022-09-27 — End: 2022-11-29

## 2022-09-27 NOTE — Telephone Encounter (Signed)
Patient would like to know if Dr. Posey Rea would like to increase his dosage of Mounjaro. He said he's had no adverse side effects. He would like a call back at [phone].

## 2022-10-10 ENCOUNTER — Ambulatory Visit (AMBULATORY_SURGERY_CENTER): Payer: Medicare Other

## 2022-10-10 VITALS — Ht 70.0 in | Wt 240.0 lb

## 2022-10-10 DIAGNOSIS — Z800 Family history of malignant neoplasm of digestive organs: Secondary | ICD-10-CM

## 2022-10-10 DIAGNOSIS — Z1211 Encounter for screening for malignant neoplasm of colon: Secondary | ICD-10-CM

## 2022-10-10 MED ORDER — NA SULFATE-K SULFATE-MG SULF 17.5-3.13-1.6 GM/177ML PO SOLN: 1.0000 | Freq: Once | ORAL | 0 refills | Status: AC

## 2022-10-10 NOTE — Progress Notes (Signed)
Pre visit completed via phone call; Patient verified name, DOB, and address;  No egg or soy allergy known to patient;  No issues known to pt with past sedation with any surgeries or procedures; Patient denies ever being told they had issues or difficulty with intubation;  No FH of Malignant Hyperthermia; Pt is not on diet pills; Pt is not on home 02; Pt is not on blood thinners;  Pt denies issues with constipation;  No A fib or A flutter; Have any cardiac testing pending--NO Pt instructed to use Singlecare.com or GoodRx for a price reduction on prep;   Insurance verified during PV appt=Medicare A/B with [employer] Medicare supplement  Patient's chart reviewed by Cathlyn Parsons CNRA prior to previsit and patient appropriate for the [facility].  Previsit completed and red dot placed by patient's name on their procedure day (on provider's schedule).     Instructions sent to patient via MyChart per his request;

## 2022-10-31 ENCOUNTER — Encounter: Payer: Self-pay | Admitting: Certified Registered Nurse Anesthetist

## 2022-11-03 ENCOUNTER — Ambulatory Visit (AMBULATORY_SURGERY_CENTER): Payer: Medicare Other | Admitting: Gastroenterology

## 2022-11-03 ENCOUNTER — Encounter: Payer: Self-pay | Admitting: Gastroenterology

## 2022-11-03 VITALS — BP 157/89 | HR 68 | Temp 98.6°F | Resp 12 | Ht 70.0 in | Wt 240.0 lb

## 2022-11-03 DIAGNOSIS — Z1211 Encounter for screening for malignant neoplasm of colon: Secondary | ICD-10-CM | POA: Diagnosis not present

## 2022-11-03 DIAGNOSIS — D122 Benign neoplasm of ascending colon: Secondary | ICD-10-CM

## 2022-11-03 DIAGNOSIS — Z800 Family history of malignant neoplasm of digestive organs: Secondary | ICD-10-CM

## 2022-11-03 DIAGNOSIS — D124 Benign neoplasm of descending colon: Secondary | ICD-10-CM | POA: Diagnosis not present

## 2022-11-03 MED ORDER — SODIUM CHLORIDE 0.9 % IV SOLN
500.0000 mL | Freq: Once | INTRAVENOUS | Status: DC
Start: 2022-11-03 — End: 2022-11-03

## 2022-11-03 NOTE — Progress Notes (Signed)
Pt's states no medical or surgical changes since previsit or office visit. VS assessed by C.W 

## 2022-11-03 NOTE — Progress Notes (Signed)
Called to room to assist during endoscopic procedure.  Patient ID and intended procedure confirmed with present staff. Received instructions for my participation in the procedure from the performing physician.  

## 2022-11-03 NOTE — Patient Instructions (Signed)
Handouts Provided:  Polyps and Diverticulosis  YOU HAD AN ENDOSCOPIC PROCEDURE TODAY AT THE [facility]:   Refer to the procedure report that was given to you for any specific questions about what was found during the examination.  If the procedure report does not answer your questions, please call your gastroenterologist to clarify.  If you requested that your care partner not be given the details of your procedure findings, then the procedure report has been included in a sealed envelope for you to review at your convenience later.  YOU SHOULD EXPECT: Some feelings of bloating in the abdomen. Passage of more gas than usual.  Walking can help get rid of the air that was put into your GI tract during the procedure and reduce the bloating. If you had a lower endoscopy (such as a colonoscopy or flexible sigmoidoscopy) you may notice spotting of blood in your stool or on the toilet paper. If you underwent a bowel prep for your procedure, you may not have a normal bowel movement for a few days.  Please Note:  You might notice some irritation and congestion in your nose or some drainage.  This is from the oxygen used during your procedure.  There is no need for concern and it should clear up in a day or so.  SYMPTOMS TO REPORT IMMEDIATELY:  Following lower endoscopy (colonoscopy or flexible sigmoidoscopy):  Excessive amounts of blood in the stool  Significant tenderness or worsening of abdominal pains  Swelling of the abdomen that is new, acute  Fever of 100F or higher  For urgent or emergent issues, a gastroenterologist can be reached at any hour by calling [phone]. Do not use MyChart messaging for urgent concerns.    DIET:  We do recommend a small meal at first, but then you may proceed to your regular diet.  Drink plenty of fluids but you should avoid alcoholic beverages for 24 hours.  ACTIVITY:  You should plan to take it easy for the rest of today and you should NOT DRIVE  or use heavy machinery until tomorrow (because of the sedation medicines used during the test).    FOLLOW UP: Our staff will call the number listed on your records the next business day following your procedure.  We will call around 7:15- 8:00 am to check on you and address any questions or concerns that you may have regarding the information given to you following your procedure. If we do not reach you, we will leave a message.     If any biopsies were taken you will be contacted by phone or by letter within the next 1-3 weeks.  Please call us at [phone] if you have not heard about the biopsies in 3 weeks.    SIGNATURES/CONFIDENTIALITY: You and/or your care partner have signed paperwork which will be entered into your electronic medical record.  These signatures attest to the fact that that the information above on your After Visit Summary has been reviewed and is understood.  Full responsibility of the confidentiality of this discharge information lies with you and/or your care-partner.  

## 2022-11-03 NOTE — Progress Notes (Signed)
History and Physical:  This patient presents for endoscopic testing for: Encounter Diagnosis  Name Primary?   Family history of colon cancer in father Yes    Reports prior colonoscopy about 10 years ago at outside practice. Several cologuard since then. Father diagnosed with colon cancer in his 52s Brother had colon polyps Patient denies chronic abdominal pain, rectal bleeding, constipation or diarrhea.   Patient is otherwise without complaints or active issues today.   Past Medical History: Past Medical History:  Diagnosis Date   Anxiety    on meds   Depressed    Dr. Fredirick [place] meds   Diabetes mellitus without complication (HCC)    on meds   Elevated BP    Elevated glucose    Hand eczema    Heart murmur    Hypertension    on meds   Osteoarthritis    bilateral feet   Other and unspecified hyperlipidemia    on meds   Other, multiple and ill-defined closed fractures of lower limb    Seasonal allergies      Past Surgical History: Past Surgical History:  Procedure Laterality Date   COLONOSCOPY  2010   HIP SURGERY Right 2012   JOINT REPLACEMENT Right 2011   R 2011   Left leg fracture repair Left 2006   TONSILLECTOMY      Allergies: No Known Allergies  Outpatient Meds: Current Outpatient Medications  Medication Sig Dispense Refill   Ascorbic Acid (VITAMIN C) 1000 MG tablet Take 1,000 mg by mouth daily.     aspirin EC 81 MG tablet Take 81 mg by mouth daily.     atorvastatin (LIPITOR) 40 MG tablet TAKE 1 TABLET(40 MG) BY MOUTH DAILY 90 tablet 2   b complex vitamins tablet Take 1 tablet by mouth daily.     cholecalciferol (VITAMIN D) 1000 UNITS tablet Take 1,000 Units by mouth daily.     escitalopram (LEXAPRO) 10 MG tablet TAKE 1 TABLET BY MOUTH DAILY 90 tablet 2   irbesartan (AVAPRO) 300 MG tablet TAKE 1 TABLET(300 MG) BY MOUTH DAILY 90 tablet 2   metFORMIN (GLUCOPHAGE) 1000 MG tablet Take 1 tablet (1,000 mg total) by mouth 2 (two) times daily. 180 tablet 2    Multiple Vitamin (MULTIVITAMIN) capsule Take 1 capsule by mouth daily.     NAPROXEN PO Take 1 tablet by mouth every 6 (six) hours as needed (arthritis).     repaglinide (PRANDIN) 1 MG tablet Take 1 tablet (1 mg total) by mouth 3 (three) times daily before meals. 270 tablet 3   tirzepatide (MOUNJARO) 5 MG/0.5ML Pen Inject 5 mg into the skin once a week. 6 mL 2   Current Facility-Administered Medications  Medication Dose Route Frequency Provider Last Rate Last Admin   0.9 %  sodium chloride infusion  500 mL Intravenous Once Danis, Starr Lake III, MD          ___________________________________________________________________ Objective   Exam:  BP (!) 173/104   Pulse 69   Temp 98.6 F (37 C) (Skin)   Resp 16   Ht 5\' 10"  (1.778 m)   Wt 240 lb (108.9 kg)   SpO2 96%   BMI 34.44 kg/m   CV: regular , S1/S2 Resp: clear to auscultation bilaterally, normal RR and effort noted GI: soft, no tenderness, with active bowel sounds.   Assessment: Encounter Diagnosis  Name Primary?   Family history of colon cancer in father Yes     Plan: Colonoscopy   The benefits and risks  of the planned procedure were described in detail with the patient or (when appropriate) their health care proxy.  Risks were outlined as including, but not limited to, bleeding, infection, perforation, adverse medication reaction leading to cardiac or pulmonary decompensation, pancreatitis (if ERCP).  The limitation of incomplete mucosal visualization was also discussed.  No guarantees or warranties were given.  The patient is appropriate for an endoscopic procedure in the ambulatory setting.   - Amada Jupiter, MD

## 2022-11-03 NOTE — Progress Notes (Signed)
Report given to PACU, vss 

## 2022-11-03 NOTE — Op Note (Signed)
[facility] Patient Name: Roberto Swanson Procedure Date: 11/03/2022 7:51 AM MRN: [id number] Endoscopist: Sherilyn Cooter L. Myrtie Neither , MD, [id number] Age: 67 Referring MD:  Date of Birth: 12/01/1955 Gender: Male Account #: [id number] Procedure:                Colonoscopy Indications:              Screening in patient at increased risk: Colorectal                            cancer in father 52 or older Medicines:                Monitored Anesthesia Care Procedure:                Pre-Anesthesia Assessment:                           - Prior to the procedure, a History and Physical                            was performed, and patient medications and                            allergies were reviewed. The patient's tolerance of                            previous anesthesia was also reviewed. The risks                            and benefits of the procedure and the sedation                            options and risks were discussed with the patient.                            All questions were answered, and informed consent                            was obtained. Prior Anticoagulants: The patient has                            taken no anticoagulant or antiplatelet agents. ASA                            Grade Assessment: II - A patient with mild systemic                            disease. After reviewing the risks and benefits,                            the patient was deemed in satisfactory condition to                            undergo the procedure.  After obtaining informed consent, the colonoscope                            was passed under direct vision. Throughout the                            procedure, the patient's blood pressure, pulse, and                            oxygen saturations were monitored continuously. The                            CF [id number] #[id number] was introduced through the anus                            and advanced to the the  cecum, identified by                            appendiceal orifice and ileocecal valve. The                            colonoscopy was performed without difficulty. The                            patient tolerated the procedure well. The quality                            of the bowel preparation was good. The ileocecal                            valve, appendiceal orifice, and rectum were                            photographed. Scope In: 8:01:20 AM Scope Out: 8:19:33 AM Scope Withdrawal Time: 0 hours 15 minutes 40 seconds  Total Procedure Duration: 0 hours 18 minutes 13 seconds  Findings:                 The perianal and digital rectal examinations were                            normal.                           Repeat examination of right colon under NBI                            performed.                           Three sessile polyps were found in the descending                            colon and ascending colon. The polyps were 3 to 5  mm in size. These polyps were removed with a cold                            snare. Resection and retrieval were complete.                           Multiple diverticula were found from transverse                            colon to sigmoid colon. (Associated haustral                            thickening in the left colon)                           Internal hemorrhoids were found.                           The exam was otherwise without abnormality on                            direct and retroflexion views. Complications:            No immediate complications. Estimated Blood Loss:     Estimated blood loss was minimal. Impression:               - Three 3 to 5 mm polyps in the descending colon                            and in the ascending colon, removed with a cold                            snare. Resected and retrieved.                           - Diverticulosis from transverse colon to sigmoid                             colon.                           - Internal hemorrhoids.                           - The examination was otherwise normal on direct                            and retroflexion views. Recommendation:           - Patient has a contact number available for                            emergencies. The signs and symptoms of potential                            delayed complications were discussed with the  patient. Return to normal activities tomorrow.                            Written discharge instructions were provided to the                            patient.                           - Resume previous diet.                           - Continue present medications.                           - Await pathology results.                           - Repeat colonoscopy is recommended for                            surveillance. The colonoscopy date will be                            determined after pathology results from today's                            exam become available for review. Aritza Brunet L. Myrtie Neither, MD 11/03/2022 8:24:24 AM This report has been signed electronically.

## 2022-11-06 ENCOUNTER — Telehealth: Payer: Self-pay

## 2022-11-06 NOTE — Telephone Encounter (Signed)
  Follow up Call-     11/03/2022    7:06 AM  Call back number  Post procedure Call Back phone  # [phone]  Permission to leave phone message Yes     Patient questions:  Do you have a fever, pain , or abdominal swelling? No. Pain Score  0 *  Have you tolerated food without any problems? Yes.    Have you been able to return to your normal activities? Yes.    Do you have any questions about your discharge instructions: Diet   No. Medications  No. Follow up visit  No.  Do you have questions or concerns about your Care? No.  Actions: * If pain score is 4 or above: No action needed, pain <4.

## 2022-11-07 ENCOUNTER — Encounter: Payer: Self-pay | Admitting: Gastroenterology

## 2022-11-13 ENCOUNTER — Ambulatory Visit (INDEPENDENT_AMBULATORY_CARE_PROVIDER_SITE_OTHER): Payer: Medicare Other

## 2022-11-13 VITALS — BP 142/96 | HR 75 | Temp 97.6°F | Ht 70.0 in | Wt 255.0 lb

## 2022-11-13 DIAGNOSIS — Z0000 Encounter for general adult medical examination without abnormal findings: Secondary | ICD-10-CM

## 2022-11-13 NOTE — Progress Notes (Addendum)
Subjective:   Roberto Swanson is a 67 y.o. male who presents for Medicare Annual/Subsequent preventive examination.  Visit Complete: In person  Patient Medicare AWV questionnaire was completed by the patient on 11/13/22; I have confirmed that all information answered by patient is correct and no changes since this date.  Review of Systems    No ROS. Medicare Wellness Visit. Additional risk factors are reflected in social history. Cardiac Risk Factors include: advanced age (>8men, >52 women);diabetes mellitus;dyslipidemia;male gender;hypertension;obesity (BMI >30kg/m2)     Objective:    Today's Vitals   11/13/22 0902  BP: (!) 142/96  Pulse: 75  Temp: 97.6 F (36.4 C)  TempSrc: Temporal  SpO2: 96%  Weight: 255 lb (115.7 kg)  Height: 5\' 10"  (1.778 m)   Body mass index is 36.59 kg/m.     11/13/2022    9:21 AM 08/02/2016    8:30 AM  Advanced Directives  Does Patient Have a Medical Advance Directive? No No;Yes  Type of [place] Power of Attorney in Chart?  Yes  Would patient like information on creating a medical advance directive? No - Patient declined     Current Medications (verified) Outpatient Encounter Medications as of 11/13/2022  Medication Sig   Ascorbic Acid (VITAMIN C) 1000 MG tablet Take 1,000 mg by mouth daily.   aspirin EC 81 MG tablet Take 81 mg by mouth daily.   atorvastatin (LIPITOR) 40 MG tablet TAKE 1 TABLET(40 MG) BY MOUTH DAILY   b complex vitamins tablet Take 1 tablet by mouth daily.   cholecalciferol (VITAMIN D) 1000 UNITS tablet Take 1,000 Units by mouth daily.   escitalopram (LEXAPRO) 10 MG tablet TAKE 1 TABLET BY MOUTH DAILY   irbesartan (AVAPRO) 300 MG tablet TAKE 1 TABLET(300 MG) BY MOUTH DAILY   metFORMIN (GLUCOPHAGE) 1000 MG tablet Take 1 tablet (1,000 mg total) by mouth 2 (two) times daily.   Multiple Vitamin (MULTIVITAMIN) capsule Take 1 capsule by mouth daily.   NAPROXEN PO  Take 1 tablet by mouth every 6 (six) hours as needed (arthritis).   repaglinide (PRANDIN) 1 MG tablet Take 1 tablet (1 mg total) by mouth 3 (three) times daily before meals.   tirzepatide [facility]) 5 MG/0.5ML Pen Inject 5 mg into the skin once a week.   No facility-administered encounter medications on file as of 11/13/2022.    Allergies (verified) Patient has no known allergies.   History: Past Medical History:  Diagnosis Date   Anxiety    on meds   Depressed    Dr. Fredirick [place] meds   Diabetes mellitus without complication (HCC)    on meds   Elevated BP    Elevated glucose    Hand eczema    Heart murmur    Hypertension    on meds   Osteoarthritis    bilateral feet   Other and unspecified hyperlipidemia    on meds   Other, multiple and ill-defined closed fractures of lower limb    Seasonal allergies    Past Surgical History:  Procedure Laterality Date   COLONOSCOPY  2010   HIP SURGERY Right 2012   JOINT REPLACEMENT Right 2011   R 2011   Left leg fracture repair Left 2006   TONSILLECTOMY     Family History  Problem Relation Age of Onset   Rheumatic fever Mother    COPD Mother    Lung cancer Mother    Colon polyps Father 69  Coronary artery disease Father    Other Father        AVR   Colon cancer Father 38   Colon polyps Brother 21   Diabetes Other    Esophageal cancer Neg Hx    Rectal cancer Neg Hx    Stomach cancer Neg Hx    Social History   Socioeconomic History   Marital status: Single    Spouse name: Not on file   Number of children: Not on file   Years of education: Not on file   Highest education level: Bachelor's degree (e.g., BA, AB, BS)  Occupational History   Occupation: Actuary: [employer]  Tobacco Use   Smoking status: Former   Smokeless tobacco: Never  Building services engineer Use: Never used  Substance and Sexual Activity   Alcohol use: Yes    Alcohol/week: 4.0 standard drinks of alcohol     Types: 4 Glasses of wine per week   Drug use: No   Sexual activity: Yes  Other Topics Concern   Not on file  Social History Narrative   Regular Exercise- yes   Social Determinants of Health   Financial Resource Strain: Low Risk  (11/13/2022)   Overall Financial Resource Strain (CARDIA)    Difficulty of Paying Living Expenses: Not very hard  Recent Concern: Financial Resource Strain - Medium Risk (08/17/2022)   Overall Financial Resource Strain (CARDIA)    Difficulty of Paying Living Expenses: Somewhat hard  Food Insecurity: No Food Insecurity (11/13/2022)   Hunger Vital Sign    Worried About Running Out of Food in the Last Year: Never true    Ran Out of Food in the Last Year: Never true  Transportation Needs: No Transportation Needs (11/13/2022)   PRAPARE - Administrator, Civil Service (Medical): No    Lack of Transportation (Non-Medical): No  Physical Activity: Sufficiently Active (11/13/2022)   Exercise Vital Sign    Days of Exercise per Week: 3 days    Minutes of Exercise per Session: 60 min  Recent Concern: Physical Activity - Insufficiently Active (08/17/2022)   Exercise Vital Sign    Days of Exercise per Week: 2 days    Minutes of Exercise per Session: 60 min  Stress: No Stress Concern Present (11/13/2022)   [employer] of Occupational Health - Occupational Stress Questionnaire    Feeling of Stress : Not at all  Social Connections: Socially Isolated (11/13/2022)   Social Connection and Isolation Panel [NHANES]    Frequency of Communication with Friends and Family: More than three times a week    Frequency of Social Gatherings with Friends and Family: Twice a week    Attends Religious Services: Never    Database administrator or Organizations: No    Attends Engineer, structural: Never    Marital Status: Never married    Tobacco Counseling Counseling given: Not Answered   Clinical Intake:  Pre-visit preparation completed: Yes  Pain :  No/denies pain     BMI - recorded: 36 Nutritional Status: BMI > 30  Obese Nutritional Risks: None Diabetes: Yes CBG done?: No Did pt. bring in CBG monitor from home?: No Nutrition Risk Assessment:  Has the patient had any N/V/D within the last 2 months?  No  Does the patient have any non-healing wounds?  No  Has the patient had any unintentional weight loss or weight gain?  No   Diabetes:  Is the patient  diabetic?  Yes  If diabetic, was a CBG obtained today?  No  Did the patient bring in their glucometer from home?  No  How often do you monitor your CBG's? Never.   Financial Strains and Diabetes Management:  Are you having any financial strains with the device, your supplies or your medication? No .  Does the patient want to be seen by Chronic Care Management for management of their diabetes?  No  Would the patient like to be referred to a Nutritionist or for Diabetic Management?  No   Diabetic Exams:  Diabetic Eye Exam: Completed 2023, has upcoming appointment in August Diabetic Foot Exam: Overdue, Pt has been advised about the importance in completing this exam. Pt is scheduled for diabetic foot exam on next appointment.   How often do you need to have someone help you when you read instructions, pamphlets, or other written materials from your doctor or pharmacy?: 1 - Never What is the last grade level you completed in school?: college degree  Interpreter Needed?: No  Information entered by :: Elyse Jarvis, CMA   Activities of Daily Living    11/13/2022    9:21 AM 11/09/2022    9:20 AM  In your present state of health, do you have any difficulty performing the following activities:  Hearing? 0 0  Vision? 0 0  Difficulty concentrating or making decisions? 0 0  Walking or climbing stairs? 0 0  Dressing or bathing? 0 0  Doing errands, shopping? 0 0  Preparing Food and eating ? N N  Using the Toilet? N N  In the past six months, have you accidently leaked urine? N  N  Do you have problems with loss of bowel control? N N  Managing your Medications? N N  Managing your Finances? N N  Housekeeping or managing your Housekeeping? N N    Patient Care Team: Plotnikov, Georgina Quint, MD as PCP - General (Internal Medicine) Thomasene Ripple, DO as PCP - Cardiology (Cardiology) Andee Poles, MD (Psychiatry) Teryl Lucy, MD (Orthopedic Surgery) Thomasene Ripple, DO as Consulting Physician (Cardiology)  Indicate any recent Medical Services you may have received from other than [facility] providers in the past year (date may be approximate).     Assessment:   This is a routine wellness examination for Rocko.  Hearing/Vision screen Patient does wear corrective lenses. Patient has some hearing difficulty and wears hearing aids.  Dietary issues and exercise activities discussed:     Goals Addressed             This Visit's Progress    Patient Stated       I would like to try and lose some weight.       Depression Screen    11/13/2022    9:20 AM 08/21/2022    1:16 PM 10/31/2021    1:27 PM 07/11/2021    8:23 AM 08/02/2016    8:28 AM  PHQ 2/9 Scores  PHQ - 2 Score 0 0 0 0 0  Exception Documentation     Medical reason    Fall Risk    11/13/2022    9:21 AM 11/09/2022    9:20 AM 08/21/2022    1:16 PM 10/31/2021    1:27 PM 07/11/2021    8:23 AM  Fall Risk   Falls in the past year? 0 0 0 0 1  Number falls in past yr: 0 0 0 0 0  Injury with Fall? 0 0 0 0 0  Risk for fall due to : No Fall Risks  No Fall Risks No Fall Risks   Follow up Falls evaluation completed  Falls evaluation completed Falls evaluation completed     MEDICARE RISK AT HOME:  Medicare Risk at Home - 11/13/22 0922     Any stairs in or around the home? Yes    If so, are there any without handrails? No    Home free of loose throw rugs in walkways, pet beds, electrical cords, etc? Yes    Adequate lighting in your home to reduce risk of falls? Yes    Life alert? Yes   Alexa   Use of a  cane, walker or w/c? No    Grab bars in the bathroom? No    Shower chair or bench in shower? No    Elevated toilet seat or a handicapped toilet? No             TIMED UP AND GO:  Was the test performed?  No    Cognitive Function:  Patient is cogitatively intact.      11/13/2022    9:21 AM  6CIT Screen  What Year? 0 points  What month? 0 points  What time? 0 points  Count back from 20 0 points  Months in reverse 0 points  Repeat phrase 0 points  Total Score 0 points    Immunizations Immunization History  Administered Date(s) Administered   COVID-19, mRNA, vaccine(Comirnaty)12 years and older 12/06/2020   Influenza Inj Mdck Quad Pf 02/07/2019   Influenza Split 05/03/2011, 03/29/2012   Influenza Whole 03/20/2008, 04/12/2009, 05/18/2010   Influenza, High Dose Seasonal PF 03/11/2020, 01/20/2021, 02/25/2022   Influenza,inj,Quad PF,6+ Mos 03/03/2014, 04/09/2015, 03/21/2018   Influenza-Unspecified 02/21/2017   PFIZER(Purple Top)SARS-COV-2 Vaccination 08/01/2019, 08/22/2019, 03/11/2020   Pfizer Covid-19 Vaccine Bivalent Booster 80yrs & up 02/07/2021, 09/27/2021   Pneumococcal Polysaccharide-23 10/09/2015   Respiratory Syncytial Virus Vaccine,Recomb Aduvanted(Arexvy) 02/25/2022   Td 07/15/2009   Tdap 03/09/2021   Zoster, Live 10/09/2015    TDAP status: Up to date  Flu Vaccine status: Up to date  Pneumococcal vaccine status: Due, Education has been provided regarding the importance of this vaccine. Advised may receive this vaccine at local pharmacy or Health Dept. Aware to provide a copy of the vaccination record if obtained from local pharmacy or Health Dept. Verbalized acceptance and understanding.  Covid-19 vaccine status: Completed vaccines  Qualifies for Shingles Vaccine? Yes   Zostavax completed No   Shingrix Completed?: No.    Education has been provided regarding the importance of this vaccine. Patient has been advised to call insurance company to determine out  of pocket expense if they have not yet received this vaccine. Advised may also receive vaccine at local pharmacy or Health Dept. Verbalized acceptance and understanding.  Screening Tests Health Maintenance  Topic Date Due   FOOT EXAM  07/18/2019   Pneumonia Vaccine 24+ Years old (2 of 2 - PCV) 10/14/2019   OPHTHALMOLOGY EXAM  08/11/2022   INFLUENZA VACCINE  12/21/2022   HEMOGLOBIN A1C  02/20/2023   Diabetic kidney evaluation - eGFR measurement  08/21/2023   Diabetic kidney evaluation - Urine ACR  08/21/2023   Medicare Annual Wellness (AWV)  11/13/2023   Fecal DNA (Cologuard)  08/02/2024   DTaP/Tdap/Td (3 - Td or Tdap) 03/10/2031   COVID-19 Vaccine  Completed   Hepatitis C Screening  Completed   HPV VACCINES  Aged Out   Colonoscopy  Discontinued   Zoster Vaccines- Shingrix  Discontinued  Health Maintenance  Health Maintenance Due  Topic Date Due   FOOT EXAM  07/18/2019   Pneumonia Vaccine 45+ Years old (2 of 2 - PCV) 10/14/2019   OPHTHALMOLOGY EXAM  08/11/2022    Colorectal cancer screening: Type of screening: Colonoscopy. Completed 11/03/22. Repeat every N/A years  Lung Cancer Screening: (Low Dose CT Chest recommended if Age 73-80 years, 20 pack-year currently smoking OR have quit w/in 15years.) does not qualify.   Lung Cancer Screening Referral: N/A  Additional Screening:  Hepatitis C Screening: does qualify; Completed 08/04/2015  Vision Screening: Recommended annual ophthalmology exams for early detection of glaucoma and other disorders of the eye. Is the patient up to date with their annual eye exam?  Yes  Who is the provider or what is the name of the office in which the patient attends annual eye exams? [facility] If pt is not established with a provider, would they like to be referred to a provider to establish care? No .   Dental Screening: Recommended annual dental exams for proper oral hygiene  Diabetic Foot Exam: Diabetic Foot Exam: Overdue, Pt has been advised  about the importance in completing this exam. Pt is scheduled for diabetic foot exam on next appointment.  Community Resource Referral / Chronic Care Management: CRR required this visit?  No   CCM required this visit?  No     Plan:     I have personally reviewed and noted the following in the patient's chart:   Medical and social history Use of alcohol, tobacco or illicit drugs  Current medications and supplements including opioid prescriptions. Patient is not currently taking opioid prescriptions. Functional ability and status Nutritional status Physical activity Advanced directives List of other physicians Hospitalizations, surgeries, and ER visits in previous 12 months Vitals Screenings to include cognitive, depression, and falls Referrals and appointments  In addition, I have reviewed and discussed with patient certain preventive protocols, quality metrics, and best practice recommendations. A written personalized care plan for preventive services as well as general preventive health recommendations were provided to patient.     Marinus Maw, CMA   11/13/2022   After Visit Summary: given to patient after visit  Nurse Notes: N/A  Medical screening examination/treatment/procedure(s) were performed by non-physician practitioner and as supervising physician I was immediately available for consultation/collaboration.  I agree with above. Jacinta Shoe, MD

## 2022-11-13 NOTE — Patient Instructions (Signed)
It was great speaking with you today!  Please schedule your next Medicare Wellness Visit with your Nurse Health Advisor in 1 year by calling [phone]. 

## 2022-11-15 ENCOUNTER — Encounter: Payer: Self-pay | Admitting: Internal Medicine

## 2022-11-17 ENCOUNTER — Other Ambulatory Visit: Payer: Self-pay | Admitting: Internal Medicine

## 2022-11-20 ENCOUNTER — Ambulatory Visit: Payer: Medicare Other | Admitting: Internal Medicine

## 2022-11-29 ENCOUNTER — Ambulatory Visit (INDEPENDENT_AMBULATORY_CARE_PROVIDER_SITE_OTHER): Payer: Medicare Other | Admitting: Internal Medicine

## 2022-11-29 ENCOUNTER — Encounter: Payer: Self-pay | Admitting: Internal Medicine

## 2022-11-29 ENCOUNTER — Other Ambulatory Visit (INDEPENDENT_AMBULATORY_CARE_PROVIDER_SITE_OTHER): Payer: Medicare Other

## 2022-11-29 VITALS — BP 136/82 | HR 77 | Temp 97.8°F | Ht 70.0 in | Wt 254.0 lb

## 2022-11-29 DIAGNOSIS — R635 Abnormal weight gain: Secondary | ICD-10-CM

## 2022-11-29 DIAGNOSIS — I2510 Atherosclerotic heart disease of native coronary artery without angina pectoris: Secondary | ICD-10-CM

## 2022-11-29 DIAGNOSIS — I10 Essential (primary) hypertension: Secondary | ICD-10-CM | POA: Diagnosis not present

## 2022-11-29 DIAGNOSIS — E119 Type 2 diabetes mellitus without complications: Secondary | ICD-10-CM

## 2022-11-29 DIAGNOSIS — Z7985 Long-term (current) use of injectable non-insulin antidiabetic drugs: Secondary | ICD-10-CM

## 2022-11-29 DIAGNOSIS — E785 Hyperlipidemia, unspecified: Secondary | ICD-10-CM

## 2022-11-29 DIAGNOSIS — K635 Polyp of colon: Secondary | ICD-10-CM

## 2022-11-29 DIAGNOSIS — N320 Bladder-neck obstruction: Secondary | ICD-10-CM | POA: Diagnosis not present

## 2022-11-29 DIAGNOSIS — I2583 Coronary atherosclerosis due to lipid rich plaque: Secondary | ICD-10-CM

## 2022-11-29 LAB — CBC WITH DIFFERENTIAL/PLATELET
Basophils Absolute: 0.1 10*3/uL (ref 0.0–0.1)
Basophils Relative: 1.5 % (ref 0.0–3.0)
Eosinophils Absolute: 0.1 10*3/uL (ref 0.0–0.7)
Eosinophils Relative: 1.8 % (ref 0.0–5.0)
HCT: 43.8 % (ref 39.0–52.0)
Hemoglobin: 14.4 g/dL (ref 13.0–17.0)
Lymphocytes Relative: 27.4 % (ref 12.0–46.0)
Lymphs Abs: 2 10*3/uL (ref 0.7–4.0)
MCHC: 32.8 g/dL (ref 30.0–36.0)
MCV: 88.9 fl (ref 78.0–100.0)
Monocytes Absolute: 0.6 10*3/uL (ref 0.1–1.0)
Monocytes Relative: 7.9 % (ref 3.0–12.0)
Neutro Abs: 4.6 10*3/uL (ref 1.4–7.7)
Neutrophils Relative %: 61.4 % (ref 43.0–77.0)
Platelets: 262 10*3/uL (ref 150.0–400.0)
RBC: 4.93 Mil/uL (ref 4.22–5.81)
RDW: 13.6 % (ref 11.5–15.5)
WBC: 7.5 10*3/uL (ref 4.0–10.5)

## 2022-11-29 LAB — URINALYSIS, ROUTINE W REFLEX MICROSCOPIC
Bilirubin Urine: NEGATIVE
Hgb urine dipstick: NEGATIVE
Leukocytes,Ua: NEGATIVE
Nitrite: NEGATIVE
RBC / HPF: NONE SEEN (ref 0–?)
Specific Gravity, Urine: >=1.03 — AB (ref 1.000–1.030)
Total Protein, Urine: 30 — AB
Urine Glucose: NEGATIVE
Urobilinogen, UA: 0.2 (ref 0.0–1.0)
pH: 6 (ref 5.0–8.0)

## 2022-11-29 LAB — PSA: PSA: 1.5 ng/mL (ref 0.10–4.00)

## 2022-11-29 LAB — COMPREHENSIVE METABOLIC PANEL
ALT: 67 U/L — ABNORMAL HIGH (ref 0–53)
AST: 60 U/L — ABNORMAL HIGH (ref 0–37)
Albumin: 4.5 g/dL (ref 3.5–5.2)
Alkaline Phosphatase: 68 U/L (ref 39–117)
BUN: 17 mg/dL (ref 6–23)
CO2: 26 mEq/L (ref 19–32)
Calcium: 9.7 mg/dL (ref 8.4–10.5)
Chloride: 100 mEq/L (ref 96–112)
Creatinine, Ser: 0.84 mg/dL (ref 0.40–1.50)
GFR: 89.85 mL/min (ref >60.00)
Glucose, Bld: 107 mg/dL — ABNORMAL HIGH (ref 70–99)
Potassium: 4.4 mEq/L (ref 3.5–5.1)
Sodium: 138 mEq/L (ref 135–145)
Total Bilirubin: 0.6 mg/dL (ref 0.2–1.2)
Total Protein: 7 g/dL (ref 6.0–8.3)

## 2022-11-29 LAB — LIPID PANEL
Cholesterol: 135 mg/dL (ref 0–200)
HDL: 37.8 mg/dL — ABNORMAL LOW (ref >39.00)
NonHDL: 97.15
Total CHOL/HDL Ratio: 4
Triglycerides: 349 mg/dL — ABNORMAL HIGH (ref 0.0–149.0)
VLDL: 69.8 mg/dL — ABNORMAL HIGH (ref 0.0–40.0)

## 2022-11-29 LAB — TSH: TSH: 1.21 u[IU]/mL (ref 0.35–5.50)

## 2022-11-29 LAB — LDL CHOLESTEROL, DIRECT: Direct LDL: 65 mg/dL

## 2022-11-29 LAB — HEMOGLOBIN A1C: Hgb A1c MFr Bld: 7.6 % — ABNORMAL HIGH (ref 4.6–6.5)

## 2022-11-29 MED ORDER — TIRZEPATIDE 12.5 MG/0.5ML ~~LOC~~ SOAJ: 12.5000 mg | SUBCUTANEOUS | 3 refills | Status: DC

## 2022-11-29 NOTE — Assessment & Plan Note (Signed)
On Mounjaro - increase to 12.5 mg weekly

## 2022-11-29 NOTE — Progress Notes (Signed)
Subjective:  Patient ID: Roberto Swanson, male    DOB: 31-Jul-1955  Age: 67 y.o. MRN: [id number]  CC: Follow-up (3 MNTH F/U)   HPI Roberto Swanson presents for DM, HTN, dyslipidemia    Outpatient Medications Prior to Visit  Medication Sig Dispense Refill   Ascorbic Acid (VITAMIN C) 1000 MG tablet Take 1,000 mg by mouth daily.     aspirin EC 81 MG tablet Take 81 mg by mouth daily.     atorvastatin (LIPITOR) 40 MG tablet TAKE 1 TABLET(40 MG) BY MOUTH DAILY 90 tablet 2   b complex vitamins tablet Take 1 tablet by mouth daily.     cholecalciferol (VITAMIN D) 1000 UNITS tablet Take 1,000 Units by mouth daily.     escitalopram (LEXAPRO) 10 MG tablet TAKE 1 TABLET BY MOUTH DAILY 90 tablet 2   irbesartan (AVAPRO) 300 MG tablet TAKE 1 TABLET(300 MG) BY MOUTH DAILY 90 tablet 2   metFORMIN (GLUCOPHAGE) 1000 MG tablet Take 1 tablet (1,000 mg total) by mouth 2 (two) times daily. 180 tablet 2   Multiple Vitamin (MULTIVITAMIN) capsule Take 1 capsule by mouth daily.     NAPROXEN PO Take 1 tablet by mouth every 6 (six) hours as needed (arthritis).     repaglinide (PRANDIN) 1 MG tablet Take 1 tablet (1 mg total) by mouth 3 (three) times daily before meals. 270 tablet 3   tirzepatide (MOUNJARO) 5 MG/0.5ML Pen Inject 5 mg into the skin once a week. 6 mL 2   No facility-administered medications prior to visit.    ROS: Review of Systems  Constitutional:  Negative for appetite change, fatigue and unexpected weight change.  HENT:  Negative for congestion, nosebleeds, sneezing, sore throat and trouble swallowing.   Eyes:  Negative for itching and visual disturbance.  Respiratory:  Negative for cough.   Cardiovascular:  Negative for chest pain, palpitations and leg swelling.  Gastrointestinal:  Negative for abdominal distention, blood in stool, diarrhea and nausea.  Genitourinary:  Negative for frequency and hematuria.  Musculoskeletal:  Negative for back pain, gait problem, joint swelling and neck  pain.  Skin:  Negative for rash.  Neurological:  Negative for dizziness, tremors, speech difficulty and weakness.  Psychiatric/Behavioral:  Negative for agitation, dysphoric mood and sleep disturbance. The patient is not nervous/anxious.     Objective:  BP 136/82 (BP Location: Left Arm, Patient Position: Sitting, Cuff Size: Large)   Pulse 77   Temp 97.8 F (36.6 C) (Oral)   Ht 5\' 10"  (1.778 m)   Wt 254 lb (115.2 kg)   SpO2 96%   BMI 36.45 kg/m   BP Readings from Last 3 Encounters:  11/29/22 136/82  11/13/22 (!) 142/96  11/03/22 (!) 157/89    Wt Readings from Last 3 Encounters:  11/29/22 254 lb (115.2 kg)  11/13/22 255 lb (115.7 kg)  11/03/22 240 lb (108.9 kg)    Physical Exam Constitutional:      General: He is not in acute distress.    Appearance: He is well-developed. He is obese.     Comments: NAD  Eyes:     Conjunctiva/sclera: Conjunctivae normal.     Pupils: Pupils are equal, round, and reactive to light.  Neck:     Thyroid: No thyromegaly.     Vascular: No JVD.  Cardiovascular:     Rate and Rhythm: Normal rate and regular rhythm.     Heart sounds: Normal heart sounds. No murmur heard.    No friction rub. No  gallop.  Pulmonary:     Effort: Pulmonary effort is normal. No respiratory distress.     Breath sounds: Normal breath sounds. No wheezing or rales.  Chest:     Chest wall: No tenderness.  Abdominal:     General: Bowel sounds are normal. There is no distension.     Palpations: Abdomen is soft. There is no mass.     Tenderness: There is no abdominal tenderness. There is no guarding or rebound.  Musculoskeletal:        General: No tenderness. Normal range of motion.     Cervical back: Normal range of motion.  Lymphadenopathy:     Cervical: No cervical adenopathy.  Skin:    General: Skin is warm and dry.     Findings: No rash.  Neurological:     Mental Status: He is alert and oriented to person, place, and time.     Cranial Nerves: No cranial nerve  deficit.     Motor: No abnormal muscle tone.     Coordination: Coordination normal.     Gait: Gait normal.     Deep Tendon Reflexes: Reflexes are normal and symmetric.  Psychiatric:        Behavior: Behavior normal.        Thought Content: Thought content normal.        Judgment: Judgment normal.     Lab Results  Component Value Date   WBC 7.2 06/23/2020   HGB 15.4 06/23/2020   HCT 46.3 06/23/2020   PLT 257.0 06/23/2020   GLUCOSE 175 (H) 08/21/2022   CHOL 145 08/21/2022   TRIG 382.0 (H) 08/21/2022   HDL 42.40 08/21/2022   LDLDIRECT 62.0 08/21/2022   LDLCALC 58 12/09/2013   ALT 40 08/21/2022   AST 24 08/21/2022   NA 134 (L) 08/21/2022   K 4.1 08/21/2022   CL 101 08/21/2022   CREATININE 0.89 08/21/2022   BUN 20 08/21/2022   CO2 26 08/21/2022   TSH 1.65 06/23/2020   PSA 1.14 06/23/2020   INR 1.09 06/16/2010   HGBA1C 8.9 (H) 08/21/2022   MICROALBUR 4.4 (H) 08/21/2022    CT CARDIAC SCORING  Addendum Date: 01/09/2019   ADDENDUM REPORT: 01/09/2019 13:22 EXAM: OVER-READ INTERPRETATION  CT CHEST The following report is an over-read performed by radiologist Dr. Maryelizabeth Rowan [facility] on 01/09/2019. This over-read does not include interpretation of cardiac or coronary anatomy or pathology. The coronary CTA interpretation by the cardiologist is attached. COMPARISON:  None. FINDINGS: Limited view of the lung parenchyma demonstrates small 6 mm nodule along the horizontal fissure. Similar nodule the oblique fissure in the RIGHT lung measuring 5 mm (image 9 and image 10 of series 3.) T which hird small nodule along the fissure abutting the pleural surface measures 6 mm also on image 17/3. Airways are normal. Limited view of the mediastinum demonstrates no adenopathy. Esophagus normal. Limited view of the upper abdomen unremarkable. Limited view of the skeleton and chest wall is unremarkable. IMPRESSION: Three small nodules in the RIGHT lung along the fissures. Fissural  nodules are typically benign. No follow-up needed if patient is low-risk. Non-contrast chest CT can be considered in 12 months if patient is high-risk. This recommendation follows the consensus statement: Guidelines for Management of Incidental Pulmonary Nodules Detected on CT Images: From the Fleischner Society 2017; Radiology 2017; 284:228-243. Electronically Signed   By: Genevive Bi M.D.   On: 01/09/2019 13:22   Result Date: 01/09/2019 CLINICAL DATA:  Risk stratification EXAM: Coronary  Calcium Score MEDICATIONS: None TECHNIQUE: The patient was scanned on a [employer]. Axial non-contrast 3 mm slices were carried out through the heart. The data set was analyzed on a dedicated work station and scored using the Agatson method. FINDINGS: Non-cardiac: See separate report from [facility]. Ascending Aorta: Normal size.  Minimal diffuse calcifications. Pericardium: Normal. Coronary arteries: Normal origin. IMPRESSION: Coronary calcium score of 760. This was 90 percentile for age and sex matched control. Electronically Signed: By: Tobias Alexander On: 01/09/2019 12:14    Assessment & Plan:   Problem List Items Addressed This Visit     HYPERTENSION - Primary    BP is better On Irbesartan - well controlled      Diabetes mellitus, type 2 (HCC)    On Mounjaro - increase to 12.5 mg weekly      Relevant Medications   tirzepatide [facility]) 12.5 MG/0.5ML Pen   Coronary atherosclerosis    On Atorvastatin      Colon polyps    Dr Floreen Comber 2024, due in 2029         Meds ordered this encounter  Medications   tirzepatide (MOUNJARO) 12.5 MG/0.5ML Pen    Sig: Inject 12.5 mg into the skin once a week.    Dispense:  6 mL    Refill:  3      Follow-up: Return in about 3 months (around 03/01/2023) for a follow-up visit.  Sonda Primes, MD

## 2022-11-29 NOTE — Assessment & Plan Note (Signed)
Dr Floreen Comber 2024, due in 2029

## 2022-11-29 NOTE — Assessment & Plan Note (Signed)
On Atorvastatin 

## 2022-11-29 NOTE — Assessment & Plan Note (Addendum)
BP is better On Irbesartan - well controlled

## 2022-11-30 NOTE — Telephone Encounter (Signed)
Noted../lmb 

## 2022-12-27 ENCOUNTER — Other Ambulatory Visit: Payer: Self-pay | Admitting: Internal Medicine

## 2023-02-04 ENCOUNTER — Other Ambulatory Visit: Payer: Self-pay | Admitting: Internal Medicine

## 2023-02-05 ENCOUNTER — Other Ambulatory Visit: Payer: Self-pay | Admitting: Internal Medicine

## 2023-02-05 ENCOUNTER — Other Ambulatory Visit (HOSPITAL_COMMUNITY): Payer: Self-pay

## 2023-02-05 ENCOUNTER — Telehealth: Payer: Self-pay

## 2023-02-05 ENCOUNTER — Encounter: Payer: Self-pay | Admitting: Internal Medicine

## 2023-02-05 NOTE — Telephone Encounter (Signed)
Pharmacy Patient Advocate Encounter   Received notification from RX Request Messages that prior authorization for Metformin 1000mg  is required/requested.   Insurance verification completed.   The patient is insured through [facility] .   Per test claim: Refill too soon. PA is not needed at this time. Medication was filled 02/04/23. Next eligible fill date is 04/12/23.  Patient wanted to make sure he did not run out of medication and needed refills not a prior authorization.

## 2023-02-07 ENCOUNTER — Other Ambulatory Visit: Payer: Self-pay | Admitting: Internal Medicine

## 2023-02-07 MED ORDER — ESCITALOPRAM OXALATE 10 MG PO TABS: 10.0000 mg | ORAL_TABLET | Freq: Every day | ORAL | 2 refills | Status: DC

## 2023-03-01 ENCOUNTER — Ambulatory Visit: Payer: Medicare Other | Admitting: Internal Medicine

## 2023-04-02 ENCOUNTER — Encounter: Payer: Self-pay | Admitting: Internal Medicine

## 2023-04-02 ENCOUNTER — Ambulatory Visit (INDEPENDENT_AMBULATORY_CARE_PROVIDER_SITE_OTHER): Payer: Medicare Other | Admitting: Internal Medicine

## 2023-04-02 ENCOUNTER — Other Ambulatory Visit (INDEPENDENT_AMBULATORY_CARE_PROVIDER_SITE_OTHER): Payer: Medicare Other

## 2023-04-02 VITALS — BP 140/88 | HR 74 | Temp 98.2°F | Ht 70.0 in | Wt 249.0 lb

## 2023-04-02 DIAGNOSIS — F32A Depression, unspecified: Secondary | ICD-10-CM | POA: Diagnosis not present

## 2023-04-02 DIAGNOSIS — E119 Type 2 diabetes mellitus without complications: Secondary | ICD-10-CM | POA: Diagnosis not present

## 2023-04-02 DIAGNOSIS — E66812 Obesity, class 2: Secondary | ICD-10-CM

## 2023-04-02 DIAGNOSIS — Z6836 Body mass index (BMI) 36.0-36.9, adult: Secondary | ICD-10-CM

## 2023-04-02 DIAGNOSIS — I10 Essential (primary) hypertension: Secondary | ICD-10-CM

## 2023-04-02 LAB — COMPREHENSIVE METABOLIC PANEL
ALT: 34 U/L (ref 0–53)
AST: 19 U/L (ref 0–37)
Albumin: 4.3 g/dL (ref 3.5–5.2)
Alkaline Phosphatase: 61 U/L (ref 39–117)
BUN: 12 mg/dL (ref 6–23)
CO2: 27 meq/L (ref 19–32)
Calcium: 9.2 mg/dL (ref 8.4–10.5)
Chloride: 101 meq/L (ref 96–112)
Creatinine, Ser: 0.91 mg/dL (ref 0.40–1.50)
GFR: 86.63 mL/min (ref >60.00)
Glucose, Bld: 163 mg/dL — ABNORMAL HIGH (ref 70–99)
Potassium: 3.7 meq/L (ref 3.5–5.1)
Sodium: 138 meq/L (ref 135–145)
Total Bilirubin: 0.4 mg/dL (ref 0.2–1.2)
Total Protein: 7 g/dL (ref 6.0–8.3)

## 2023-04-02 LAB — HEMOGLOBIN A1C: Hgb A1c MFr Bld: 6.9 % — ABNORMAL HIGH (ref 4.6–6.5)

## 2023-04-02 MED ORDER — TIRZEPATIDE 15 MG/0.5ML ~~LOC~~ SOAJ
15.0000 mg | SUBCUTANEOUS | 3 refills | Status: DC
Start: 2023-04-02 — End: 2023-07-30

## 2023-04-02 NOTE — Progress Notes (Signed)
Subjective:  Patient ID: Roberto Swanson, male    DOB: 04/07/1956  Age: 67 y.o. MRN: [id number]  CC: Medical Management of Chronic Issues (3 MNTH F/U)   HPI Roberto Swanson presents for DM, HTN, dyslipidemia  Outpatient Medications Prior to Visit  Medication Sig Dispense Refill   Ascorbic Acid (VITAMIN C) 1000 MG tablet Take 1,000 mg by mouth daily.     aspirin EC 81 MG tablet Take 81 mg by mouth daily.     atorvastatin (LIPITOR) 40 MG tablet TAKE 1 TABLET(40 MG) BY MOUTH DAILY 90 tablet 3   b complex vitamins tablet Take 1 tablet by mouth daily.     cholecalciferol (VITAMIN D) 1000 UNITS tablet Take 1,000 Units by mouth daily.     escitalopram (LEXAPRO) 10 MG tablet Take 1 tablet (10 mg total) by mouth daily. 90 tablet 2   irbesartan (AVAPRO) 300 MG tablet TAKE 1 TABLET(300 MG) BY MOUTH DAILY 90 tablet 2   metFORMIN (GLUCOPHAGE) 1000 MG tablet Take 1 tablet (1,000 mg total) by mouth 2 (two) times daily. 180 tablet 2   Multiple Vitamin (MULTIVITAMIN) capsule Take 1 capsule by mouth daily.     NAPROXEN PO Take 1 tablet by mouth every 6 (six) hours as needed (arthritis).     repaglinide (PRANDIN) 1 MG tablet Take 1 tablet (1 mg total) by mouth 3 (three) times daily before meals. 270 tablet 3   tirzepatide (MOUNJARO) 12.5 MG/0.5ML Pen Inject 12.5 mg into the skin once a week. 6 mL 3   No facility-administered medications prior to visit.    ROS: Review of Systems  Constitutional:  Negative for appetite change, fatigue and unexpected weight change.  HENT:  Negative for congestion, nosebleeds, sneezing, sore throat and trouble swallowing.   Eyes:  Negative for itching and visual disturbance.  Respiratory:  Negative for cough.   Cardiovascular:  Negative for chest pain, palpitations and leg swelling.  Gastrointestinal:  Negative for abdominal distention, blood in stool, diarrhea and nausea.  Genitourinary:  Negative for frequency and hematuria.  Musculoskeletal:  Negative for back  pain, gait problem, joint swelling and neck pain.  Skin:  Negative for rash.  Neurological:  Negative for dizziness, tremors, speech difficulty and weakness.  Psychiatric/Behavioral:  Negative for agitation, dysphoric mood and sleep disturbance. The patient is not nervous/anxious.     Objective:  BP (!) 140/88 (BP Location: Left Arm, Patient Position: Sitting, Cuff Size: Normal)   Pulse 74   Temp 98.2 F (36.8 C) (Oral)   Ht 5\' 10"  (1.778 m)   Wt 249 lb (112.9 kg)   SpO2 97%   BMI 35.73 kg/m   BP Readings from Last 3 Encounters:  04/02/23 (!) 140/88  11/29/22 136/82  11/13/22 (!) 142/96    Wt Readings from Last 3 Encounters:  04/02/23 249 lb (112.9 kg)  11/29/22 254 lb (115.2 kg)  11/13/22 255 lb (115.7 kg)    Physical Exam Constitutional:      General: He is not in acute distress.    Appearance: He is well-developed. He is obese.     Comments: NAD  Eyes:     Conjunctiva/sclera: Conjunctivae normal.     Pupils: Pupils are equal, round, and reactive to light.  Neck:     Thyroid: No thyromegaly.     Vascular: No JVD.  Cardiovascular:     Rate and Rhythm: Normal rate and regular rhythm.     Heart sounds: Normal heart sounds. No murmur heard.  No friction rub. No gallop.  Pulmonary:     Effort: Pulmonary effort is normal. No respiratory distress.     Breath sounds: Normal breath sounds. No wheezing or rales.  Chest:     Chest wall: No tenderness.  Abdominal:     General: Bowel sounds are normal. There is no distension.     Palpations: Abdomen is soft. There is no mass.     Tenderness: There is no abdominal tenderness. There is no guarding or rebound.  Musculoskeletal:        General: No tenderness. Normal range of motion.     Cervical back: Normal range of motion.  Lymphadenopathy:     Cervical: No cervical adenopathy.  Skin:    General: Skin is warm and dry.     Findings: No rash.  Neurological:     Mental Status: He is alert and oriented to person, place,  and time.     Cranial Nerves: No cranial nerve deficit.     Motor: No abnormal muscle tone.     Coordination: Coordination normal.     Gait: Gait normal.     Deep Tendon Reflexes: Reflexes are normal and symmetric.  Psychiatric:        Behavior: Behavior normal.        Thought Content: Thought content normal.        Judgment: Judgment normal.     Lab Results  Component Value Date   WBC 7.5 11/29/2022   HGB 14.4 11/29/2022   HCT 43.8 11/29/2022   PLT 262.0 11/29/2022   GLUCOSE 107 (H) 11/29/2022   CHOL 135 11/29/2022   TRIG 349.0 (H) 11/29/2022   HDL 37.80 (L) 11/29/2022   LDLDIRECT 65.0 11/29/2022   LDLCALC 58 12/09/2013   ALT 67 (H) 11/29/2022   AST 60 (H) 11/29/2022   NA 138 11/29/2022   K 4.4 11/29/2022   CL 100 11/29/2022   CREATININE 0.84 11/29/2022   BUN 17 11/29/2022   CO2 26 11/29/2022   TSH 1.21 11/29/2022   PSA 1.50 11/29/2022   INR 1.09 06/16/2010   HGBA1C 7.6 (H) 11/29/2022   MICROALBUR 4.4 (H) 08/21/2022    CT CARDIAC SCORING  Addendum Date: 01/09/2019   ADDENDUM REPORT: 01/09/2019 13:22 EXAM: OVER-READ INTERPRETATION  CT CHEST The following report is an over-read performed by radiologist Dr. Maryelizabeth Rowan [facility] on 01/09/2019. This over-read does not include interpretation of cardiac or coronary anatomy or pathology. The coronary CTA interpretation by the cardiologist is attached. COMPARISON:  None. FINDINGS: Limited view of the lung parenchyma demonstrates small 6 mm nodule along the horizontal fissure. Similar nodule the oblique fissure in the RIGHT lung measuring 5 mm (image 9 and image 10 of series 3.) T which hird small nodule along the fissure abutting the pleural surface measures 6 mm also on image 17/3. Airways are normal. Limited view of the mediastinum demonstrates no adenopathy. Esophagus normal. Limited view of the upper abdomen unremarkable. Limited view of the skeleton and chest wall is unremarkable. IMPRESSION: Three small  nodules in the RIGHT lung along the fissures. Fissural nodules are typically benign. No follow-up needed if patient is low-risk. Non-contrast chest CT can be considered in 12 months if patient is high-risk. This recommendation follows the consensus statement: Guidelines for Management of Incidental Pulmonary Nodules Detected on CT Images: From the Fleischner Society 2017; Radiology 2017; 284:228-243. Electronically Signed   By: Genevive Bi M.D.   On: 01/09/2019 13:22   Result Date: 01/09/2019 CLINICAL  DATA:  Risk stratification EXAM: Coronary Calcium Score MEDICATIONS: None TECHNIQUE: The patient was scanned on a [employer] scanner. Axial non-contrast 3 mm slices were carried out through the heart. The data set was analyzed on a dedicated work station and scored using the Agatson method. FINDINGS: Non-cardiac: See separate report from [facility]. Ascending Aorta: Normal size.  Minimal diffuse calcifications. Pericardium: Normal. Coronary arteries: Normal origin. IMPRESSION: Coronary calcium score of 760. This was 90 percentile for age and sex matched control. Electronically Signed: By: Tobias Alexander On: 01/09/2019 12:14    Assessment & Plan:   Problem List Items Addressed This Visit     Chronic depression    Chronic  On Wellbutrin, Lexapro      HYPERTENSION    BP is better On Irbesartan - well controlled      Diabetes mellitus, type 2 (HCC) - Primary    On Mounjaro - increase to 12.5 mg weekly      Relevant Medications   tirzepatide [facility]) 15 MG/0.5ML Pen   Other Relevant Orders   Hemoglobin A1c   Comprehensive metabolic panel   Obesity    Wt Readings from Last 3 Encounters:  04/02/23 243 lb (110.2 kg)  11/29/22 254 lb (115.2 kg)  11/13/22 255 lb (115.7 kg)         Relevant Medications   tirzepatide (MOUNJARO) 15 MG/0.5ML Pen      Meds ordered this encounter  Medications   tirzepatide (MOUNJARO) 15 MG/0.5ML Pen    Sig: Inject 15 mg into the skin  once a week.    Dispense:  6 mL    Refill:  3      Follow-up: Return in about 3 months (around 07/03/2023) for a follow-up visit.  Sonda Primes, MD

## 2023-04-02 NOTE — Assessment & Plan Note (Signed)
Wt Readings from Last 3 Encounters:  04/02/23 243 lb (110.2 kg)  11/29/22 254 lb (115.2 kg)  11/13/22 255 lb (115.7 kg)

## 2023-04-02 NOTE — Assessment & Plan Note (Signed)
Chronic  On Wellbutrin, Lexapro

## 2023-04-02 NOTE — Assessment & Plan Note (Signed)
On Mounjaro - increase to 12.5 mg weekly

## 2023-04-02 NOTE — Assessment & Plan Note (Signed)
BP is better On Irbesartan - well controlled

## 2023-04-10 ENCOUNTER — Other Ambulatory Visit: Payer: Self-pay | Admitting: Medical Genetics

## 2023-04-10 DIAGNOSIS — Z006 Encounter for examination for normal comparison and control in clinical research program: Secondary | ICD-10-CM

## 2023-05-14 ENCOUNTER — Other Ambulatory Visit (HOSPITAL_COMMUNITY): Payer: Medicare Other | Attending: Medical Genetics

## 2023-05-14 ENCOUNTER — Encounter: Payer: Self-pay | Admitting: Internal Medicine

## 2023-05-14 NOTE — Telephone Encounter (Signed)
 Care team updated and letter sent for eye exam notes.

## 2023-05-22 ENCOUNTER — Encounter: Payer: Self-pay | Admitting: Internal Medicine

## 2023-05-22 LAB — HM DIABETES EYE EXAM

## 2023-06-11 ENCOUNTER — Encounter: Payer: Self-pay | Admitting: Internal Medicine

## 2023-07-30 ENCOUNTER — Ambulatory Visit (INDEPENDENT_AMBULATORY_CARE_PROVIDER_SITE_OTHER): Payer: Medicare Other | Admitting: Internal Medicine

## 2023-07-30 ENCOUNTER — Encounter: Payer: Self-pay | Admitting: Internal Medicine

## 2023-07-30 VITALS — BP 130/80 | HR 75 | Temp 98.6°F | Ht 70.0 in | Wt 237.0 lb

## 2023-07-30 DIAGNOSIS — Z7985 Long-term (current) use of injectable non-insulin antidiabetic drugs: Secondary | ICD-10-CM

## 2023-07-30 DIAGNOSIS — E119 Type 2 diabetes mellitus without complications: Secondary | ICD-10-CM

## 2023-07-30 DIAGNOSIS — F32A Depression, unspecified: Secondary | ICD-10-CM

## 2023-07-30 DIAGNOSIS — I2583 Coronary atherosclerosis due to lipid rich plaque: Secondary | ICD-10-CM | POA: Diagnosis not present

## 2023-07-30 DIAGNOSIS — E781 Pure hyperglyceridemia: Secondary | ICD-10-CM

## 2023-07-30 LAB — COMPREHENSIVE METABOLIC PANEL
ALT: 27 U/L (ref 0–53)
AST: 16 U/L (ref 0–37)
Albumin: 4.4 g/dL (ref 3.5–5.2)
Alkaline Phosphatase: 59 U/L (ref 39–117)
BUN: 18 mg/dL (ref 6–23)
CO2: 25 meq/L (ref 19–32)
Calcium: 9.3 mg/dL (ref 8.4–10.5)
Chloride: 103 meq/L (ref 96–112)
Creatinine, Ser: 1.27 mg/dL (ref 0.40–1.50)
GFR: 57.94 mL/min — ABNORMAL LOW (ref >60.00)
Glucose, Bld: 123 mg/dL — ABNORMAL HIGH (ref 70–99)
Potassium: 3.6 meq/L (ref 3.5–5.1)
Sodium: 139 meq/L (ref 135–145)
Total Bilirubin: 0.4 mg/dL (ref 0.2–1.2)
Total Protein: 7.2 g/dL (ref 6.0–8.3)

## 2023-07-30 LAB — HEMOGLOBIN A1C: Hgb A1c MFr Bld: 6.4 % (ref 4.6–6.5)

## 2023-07-30 MED ORDER — TIRZEPATIDE 15 MG/0.5ML ~~LOC~~ SOAJ: 15.0000 mg | SUBCUTANEOUS | 5 refills | Status: AC

## 2023-07-30 NOTE — Progress Notes (Signed)
 Subjective:  Patient ID: Roberto Swanson, male    DOB: August 09, 1955  Age: 68 y.o. MRN: [id number]  CC: Medical Management of Chronic Issues (3 mnth f/u)   HPI Roberto Swanson presents for DM, HTN, dyslipidemia  Outpatient Medications Prior to Visit  Medication Sig Dispense Refill   Ascorbic Acid (VITAMIN C) 1000 MG tablet Take 1,000 mg by mouth daily.     aspirin EC 81 MG tablet Take 81 mg by mouth daily.     atorvastatin (LIPITOR) 40 MG tablet TAKE 1 TABLET(40 MG) BY MOUTH DAILY 90 tablet 3   b complex vitamins tablet Take 1 tablet by mouth daily.     cholecalciferol (VITAMIN D) 1000 UNITS tablet Take 1,000 Units by mouth daily.     escitalopram (LEXAPRO) 10 MG tablet Take 1 tablet (10 mg total) by mouth daily. 90 tablet 2   irbesartan (AVAPRO) 300 MG tablet TAKE 1 TABLET(300 MG) BY MOUTH DAILY 90 tablet 2   metFORMIN (GLUCOPHAGE) 1000 MG tablet Take 1 tablet (1,000 mg total) by mouth 2 (two) times daily. 180 tablet 2   Multiple Vitamin (MULTIVITAMIN) capsule Take 1 capsule by mouth daily.     NAPROXEN PO Take 1 tablet by mouth every 6 (six) hours as needed (arthritis).     repaglinide (PRANDIN) 1 MG tablet Take 1 tablet (1 mg total) by mouth 3 (three) times daily before meals. 270 tablet 3   tirzepatide (MOUNJARO) 15 MG/0.5ML Pen Inject 15 mg into the skin once a week. 6 mL 3   No facility-administered medications prior to visit.    ROS: Review of Systems  Constitutional:  Negative for appetite change, fatigue and unexpected weight change.  HENT:  Negative for congestion, nosebleeds, sneezing, sore throat and trouble swallowing.   Eyes:  Negative for itching and visual disturbance.  Respiratory:  Negative for cough.   Cardiovascular:  Negative for chest pain, palpitations and leg swelling.  Gastrointestinal:  Negative for abdominal distention, blood in stool, diarrhea and nausea.  Genitourinary:  Negative for frequency and hematuria.  Musculoskeletal:  Negative for back  pain, gait problem, joint swelling and neck pain.  Skin:  Negative for rash.  Neurological:  Negative for dizziness, tremors, speech difficulty and weakness.  Psychiatric/Behavioral:  Negative for agitation, dysphoric mood and sleep disturbance. The patient is not nervous/anxious.     Objective:  BP 130/80   Pulse 75   Temp 98.6 F (37 C) (Oral)   Ht 5\' 10"  (1.778 m)   Wt 237 lb (107.5 kg)   SpO2 93%   BMI 34.01 kg/m   BP Readings from Last 3 Encounters:  07/30/23 130/80  04/02/23 (!) 140/88  11/29/22 136/82    Wt Readings from Last 3 Encounters:  07/30/23 237 lb (107.5 kg)  04/02/23 249 lb (112.9 kg)  11/29/22 254 lb (115.2 kg)    Physical Exam Constitutional:      General: He is not in acute distress.    Appearance: He is well-developed. He is obese.     Comments: NAD  Eyes:     Conjunctiva/sclera: Conjunctivae normal.     Pupils: Pupils are equal, round, and reactive to light.  Neck:     Thyroid: No thyromegaly.     Vascular: No JVD.  Cardiovascular:     Rate and Rhythm: Normal rate and regular rhythm.     Heart sounds: Normal heart sounds. No murmur heard.    No friction rub. No gallop.  Pulmonary:  Effort: Pulmonary effort is normal. No respiratory distress.     Breath sounds: Normal breath sounds. No wheezing or rales.  Chest:     Chest wall: No tenderness.  Abdominal:     General: Bowel sounds are normal. There is no distension.     Palpations: Abdomen is soft. There is no mass.     Tenderness: There is no abdominal tenderness. There is no guarding or rebound.  Musculoskeletal:        General: No tenderness. Normal range of motion.     Cervical back: Normal range of motion.  Lymphadenopathy:     Cervical: No cervical adenopathy.  Skin:    General: Skin is warm and dry.     Findings: No rash.  Neurological:     Mental Status: He is alert and oriented to person, place, and time.     Cranial Nerves: No cranial nerve deficit.     Motor: No  abnormal muscle tone.     Coordination: Coordination normal.     Gait: Gait normal.     Deep Tendon Reflexes: Reflexes are normal and symmetric.  Psychiatric:        Behavior: Behavior normal.        Thought Content: Thought content normal.        Judgment: Judgment normal.     Lab Results  Component Value Date   WBC 7.5 11/29/2022   HGB 14.4 11/29/2022   HCT 43.8 11/29/2022   PLT 262.0 11/29/2022   GLUCOSE 163 (H) 04/02/2023   CHOL 135 11/29/2022   TRIG 349.0 (H) 11/29/2022   HDL 37.80 (L) 11/29/2022   LDLDIRECT 65.0 11/29/2022   LDLCALC 58 12/09/2013   ALT 34 04/02/2023   AST 19 04/02/2023   NA 138 04/02/2023   K 3.7 04/02/2023   CL 101 04/02/2023   CREATININE 0.91 04/02/2023   BUN 12 04/02/2023   CO2 27 04/02/2023   TSH 1.21 11/29/2022   PSA 1.50 11/29/2022   INR 1.09 06/16/2010   HGBA1C 6.9 (H) 04/02/2023   MICROALBUR 4.4 (H) 08/21/2022    CT CARDIAC SCORING Addendum Date: 01/09/2019 ADDENDUM REPORT: 01/09/2019 13:22 EXAM: OVER-READ INTERPRETATION  CT CHEST The following report is an over-read performed by radiologist Dr. Maryelizabeth Rowan [facility] on 01/09/2019. This over-read does not include interpretation of cardiac or coronary anatomy or pathology. The coronary CTA interpretation by the cardiologist is attached. COMPARISON:  None. FINDINGS: Limited view of the lung parenchyma demonstrates small 6 mm nodule along the horizontal fissure. Similar nodule the oblique fissure in the RIGHT lung measuring 5 mm (image 9 and image 10 of series 3.) T which hird small nodule along the fissure abutting the pleural surface measures 6 mm also on image 17/3. Airways are normal. Limited view of the mediastinum demonstrates no adenopathy. Esophagus normal. Limited view of the upper abdomen unremarkable. Limited view of the skeleton and chest wall is unremarkable. IMPRESSION: Three small nodules in the RIGHT lung along the fissures. Fissural nodules are typically benign.  No follow-up needed if patient is low-risk. Non-contrast chest CT can be considered in 12 months if patient is high-risk. This recommendation follows the consensus statement: Guidelines for Management of Incidental Pulmonary Nodules Detected on CT Images: From the Fleischner Society 2017; Radiology 2017; 284:228-243. Electronically Signed   By: Genevive Bi M.D.   On: 01/09/2019 13:22   Result Date: 01/09/2019 CLINICAL DATA:  Risk stratification EXAM: Coronary Calcium Score MEDICATIONS: None TECHNIQUE: The patient was scanned on  a Office manager. Axial non-contrast 3 mm slices were carried out through the heart. The data set was analyzed on a dedicated work station and scored using the Agatson method. FINDINGS: Non-cardiac: See separate report from [facility]. Ascending Aorta: Normal size.  Minimal diffuse calcifications. Pericardium: Normal. Coronary arteries: Normal origin. IMPRESSION: Coronary calcium score of 760. This was 90 percentile for age and sex matched control. Electronically Signed: By: Tobias Alexander On: 01/09/2019 12:14    Assessment & Plan:   Problem List Items Addressed This Visit     Chronic depression   Chronic  On Wellbutrin, Lexapro      Diabetes mellitus, type 2 (HCC) - Primary   On Mounjaro 15 mg/d      Relevant Medications   tirzepatide (MOUNJARO) 15 MG/0.5ML Pen   Other Relevant Orders   Comprehensive metabolic panel   Hemoglobin A1c   Coronary atherosclerosis   On Atorvastatin      Hypertriglyceridemia, essential    On Lipitor, fenofibrate.         Meds ordered this encounter  Medications   tirzepatide (MOUNJARO) 15 MG/0.5ML Pen    Sig: Inject 15 mg into the skin once a week.    Dispense:  6 mL    Refill:  5      Follow-up: Return in about 3 months (around 10/30/2023) for Wellness Exam.  Sonda Primes, MD

## 2023-07-30 NOTE — Assessment & Plan Note (Signed)
 On Mounjaro 15 mg/d

## 2023-07-30 NOTE — Assessment & Plan Note (Signed)
On Lipitor, fenofibrate.

## 2023-07-30 NOTE — Assessment & Plan Note (Signed)
-  On Atorvastatin

## 2023-07-30 NOTE — Assessment & Plan Note (Signed)
Chronic  On Wellbutrin, Lexapro

## 2023-08-23 ENCOUNTER — Encounter: Payer: Self-pay | Admitting: Internal Medicine

## 2023-08-28 MED ORDER — ATORVASTATIN CALCIUM 40 MG PO TABS: ORAL_TABLET | ORAL | 3 refills | Status: AC

## 2023-10-18 ENCOUNTER — Other Ambulatory Visit: Payer: Self-pay | Admitting: Internal Medicine

## 2023-10-29 ENCOUNTER — Other Ambulatory Visit: Payer: Self-pay | Admitting: Internal Medicine

## 2023-11-05 ENCOUNTER — Telehealth: Admitting: Internal Medicine

## 2023-11-13 ENCOUNTER — Telehealth (INDEPENDENT_AMBULATORY_CARE_PROVIDER_SITE_OTHER): Admitting: Internal Medicine

## 2023-11-13 ENCOUNTER — Encounter: Payer: Self-pay | Admitting: Internal Medicine

## 2023-11-13 VITALS — BP 135/80 | Wt 234.0 lb

## 2023-11-13 DIAGNOSIS — E785 Hyperlipidemia, unspecified: Secondary | ICD-10-CM | POA: Diagnosis not present

## 2023-11-13 DIAGNOSIS — I10 Essential (primary) hypertension: Secondary | ICD-10-CM

## 2023-11-13 DIAGNOSIS — Z7985 Long-term (current) use of injectable non-insulin antidiabetic drugs: Secondary | ICD-10-CM | POA: Diagnosis not present

## 2023-11-13 DIAGNOSIS — Z6836 Body mass index (BMI) 36.0-36.9, adult: Secondary | ICD-10-CM

## 2023-11-13 DIAGNOSIS — E66812 Obesity, class 2: Secondary | ICD-10-CM

## 2023-11-13 DIAGNOSIS — E119 Type 2 diabetes mellitus without complications: Secondary | ICD-10-CM | POA: Diagnosis not present

## 2023-11-13 NOTE — Assessment & Plan Note (Signed)
 On Mounjaro  15 mg/wk - doing well

## 2023-11-13 NOTE — Progress Notes (Signed)
 Virtual Visit via Video Note  I connected with Curtistine JONETTA Mckusick on 11/13/23 at  8:10 AM EDT by a video enabled telemedicine application and verified that I am speaking with the correct person using two identifiers.   I discussed the limitations of evaluation and management by telemedicine and the availability of in person appointments. The patient expressed understanding and agreed to proceed.  I was located at our [facility] office. The patient was at home. There was no one else present in the visit.  Chief Complaint  Patient presents with   Medical Management of Chronic Issues    3 MNTH F/U     History of Present Illness:  F/u on DM, HTN, dyslipidemia  ROS Mild nausea  Observations/Objective: The patient appears to be in no acute distress. Wt 234 lbs. BP 135/80  Assessment and Plan:  Problem List Items Addressed This Visit     Dyslipidemia   Cont on Crestor      HYPERTENSION   BP is better On Irbesartan  - well controlled      Diabetes mellitus, type 2 (HCC) - Primary   On Mounjaro  15 mg/wk - doing well      Obesity   Doing better        No orders of the defined types were placed in this encounter.    Follow Up Instructions:    I discussed the assessment and treatment plan with the patient. The patient was provided an opportunity to ask questions and all were answered. The patient agreed with the plan and demonstrated an understanding of the instructions.   The patient was advised to call back or seek an in-person evaluation if the symptoms worsen or if the condition fails to improve as anticipated.  I provided face-to-face time during this encounter. We were at different locations.   Marolyn Noel, MD

## 2023-11-13 NOTE — Assessment & Plan Note (Signed)
 Doing better.

## 2023-11-13 NOTE — Assessment & Plan Note (Signed)
BP is better On Irbesartan - well controlled

## 2023-11-13 NOTE — Assessment & Plan Note (Signed)
Cont on Crestor 

## 2023-11-14 ENCOUNTER — Other Ambulatory Visit (HOSPITAL_COMMUNITY)
Admission: RE | Admit: 2023-11-14 | Discharge: 2023-11-14 | Disposition: A | Discharge status: Home / Self Care | Source: Ambulatory Visit | Attending: Medical Genetics | Admitting: Medical Genetics

## 2023-11-14 ENCOUNTER — Ambulatory Visit

## 2023-11-14 ENCOUNTER — Other Ambulatory Visit (INDEPENDENT_AMBULATORY_CARE_PROVIDER_SITE_OTHER)

## 2023-11-14 ENCOUNTER — Other Ambulatory Visit: Payer: Self-pay | Admitting: Internal Medicine

## 2023-11-14 VITALS — Ht 70.0 in | Wt 234.0 lb

## 2023-11-14 DIAGNOSIS — E119 Type 2 diabetes mellitus without complications: Secondary | ICD-10-CM

## 2023-11-14 DIAGNOSIS — Z006 Encounter for examination for normal comparison and control in clinical research program: Secondary | ICD-10-CM | POA: Insufficient documentation

## 2023-11-14 DIAGNOSIS — Z0000 Encounter for general adult medical examination without abnormal findings: Secondary | ICD-10-CM

## 2023-11-14 LAB — MICROALBUMIN / CREATININE URINE RATIO
Creatinine,U: 111.5 mg/dL
Microalb Creat Ratio: 39.6 mg/g — ABNORMAL HIGH (ref 0.0–30.0)
Microalb, Ur: 4.4 mg/dL — ABNORMAL HIGH (ref 0.0–1.9)

## 2023-11-14 NOTE — Patient Instructions (Signed)
 Roberto Swanson , Thank you for taking time out of your busy schedule to complete your Annual Wellness Visit with me. I enjoyed our conversation and look forward to speaking with you again next year. I, as well as your care team,  appreciate your ongoing commitment to your health goals. Please review the following plan we discussed and let me know if I can assist you in the future. Your Game plan/ To Do List   Follow up Visits: Next Medicare AWV with our clinical staff: 11/14/2024.   Have you seen your provider in the last 6 months (3 months if uncontrolled diabetes)? Yes Next Office Visit with your provider: Patient will schedule a visit for 3 months out from yesterday.  His last office visit 07/30/23.  Clinician Recommendations:  Aim for 30 minutes of exercise or brisk walking, 6-8 glasses of water, and 5 servings of fruits and vegetables each day. Please schedule your next visit while at office for lab work today.  You are due for a foot exam and can have that done during your next office visit.  You are also due for a kidney exam, which will be done today during your lab visit.       This is a list of the screening recommended for you and due dates:  Health Maintenance  Topic Date Due   Complete foot exam   07/18/2019   Yearly kidney health urinalysis for diabetes  08/21/2023   Medicare Annual Wellness Visit  11/13/2023   Flu Shot  12/21/2023   Hemoglobin A1C  01/30/2024   Eye exam for diabetics  05/21/2024   Yearly kidney function blood test for diabetes  07/29/2024   Cologuard (Stool DNA test)  08/02/2024   DTaP/Tdap/Td vaccine (3 - Td or Tdap) 03/10/2031   Pneumococcal Vaccine for age over 65  Completed   COVID-19 Vaccine  Completed   Hepatitis C Screening  Completed   Hepatitis B Vaccine  Aged Out   HPV Vaccine  Aged Out   Meningitis B Vaccine  Aged Out   Colon Cancer Screening  Discontinued   Zoster (Shingles) Vaccine  Discontinued    Advanced directives: (Copy Requested) Please  bring a copy of your health care power of attorney and living will to the office to be added to your chart at your convenience. You can mail to [facility] [address] [place], [place] [address] or email to [email or web address] Advance Care Planning is important because it:  [x]  Makes sure you receive the medical care that is consistent with your values, goals, and preferences  [x]  It provides guidance to your family and loved ones and reduces their decisional burden about whether or not they are making the right decisions based on your wishes.  Follow the link provided in your after visit summary or read over the paperwork we have mailed to you to help you started getting your Advance Directives in place. If you need assistance in completing these, please reach out to us  so that we can help you!  See attachments for Preventive Care and Fall Prevention Tips.

## 2023-11-14 NOTE — Progress Notes (Addendum)
 Subjective:   Roberto Swanson is a 68 y.o. who presents for a Medicare Wellness preventive visit.  As a reminder, Annual Wellness Visits don't include a physical exam, and some assessments may be limited, especially if this visit is performed virtually. We may recommend an in-person follow-up visit with your provider if needed.  Visit Complete: Virtual I connected with  Roberto Swanson on 11/14/23 by a video and audio enabled telemedicine application and verified that I am speaking with the correct person using two identifiers.  Patient Location: Home  Provider Location: Home Office  I discussed the limitations of evaluation and management by telemedicine. The patient expressed understanding and agreed to proceed.  Vital Signs: Because this visit was a virtual/telehealth visit, some criteria may be missing or patient reported. Any vitals not documented were not able to be obtained and vitals that have been documented are patient reported.  Persons Participating in Visit: Patient.  AWV Questionnaire: Yes: Patient Medicare AWV questionnaire was completed by the patient on 11/12/2023; I have confirmed that all information answered by patient is correct and no changes since this date.  Cardiac Risk Factors include: advanced age (>59men, >24 women);hypertension;male gender;diabetes mellitus;dyslipidemia;obesity (BMI >30kg/m2)     Objective:    Today's Vitals   11/14/23 0948  Weight: 234 lb (106.1 kg)  Height: 5' 10 (1.778 m)   Body mass index is 33.58 kg/m.     11/14/2023   10:11 AM 11/13/2022    9:21 AM 08/02/2016    8:30 AM  Advanced Directives  Does Patient Have a Medical Advance Directive? Yes No No;Yes   Type of Estate agent of [place];Living will  Healthcare Power of Attorney  Copy of Healthcare Power of Attorney in Chart? No - copy requested  Yes   Would patient like information on creating a medical advance directive?  No - Patient declined       Data saved with a previous flowsheet row definition    Current Medications (verified) Outpatient Encounter Medications as of 11/14/2023  Medication Sig   Ascorbic Acid (VITAMIN C) 1000 MG tablet Take 1,000 mg by mouth daily.   aspirin EC 81 MG tablet Take 81 mg by mouth daily.   atorvastatin  (LIPITOR) 40 MG tablet TAKE 1 TABLET(40 MG) BY MOUTH DAILY   b complex vitamins tablet Take 1 tablet by mouth daily.   cholecalciferol (VITAMIN D) 1000 UNITS tablet Take 1,000 Units by mouth daily.   escitalopram  (LEXAPRO ) 10 MG tablet TAKE 1 TABLET(10 MG) BY MOUTH DAILY   irbesartan  (AVAPRO ) 300 MG tablet TAKE 1 TABLET(300 MG) BY MOUTH DAILY   metFORMIN  (GLUCOPHAGE ) 1000 MG tablet TAKE 1 TABLET TWICE DAILY   Multiple Vitamin (MULTIVITAMIN) capsule Take 1 capsule by mouth daily.   NAPROXEN PO Take 1 tablet by mouth every 6 (six) hours as needed (arthritis).   tirzepatide  (MOUNJARO ) 15 MG/0.5ML Pen Inject 15 mg into the skin once a week.   No facility-administered encounter medications on file as of 11/14/2023.    Allergies (verified) Patient has no known allergies.   History: Past Medical History:  Diagnosis Date   Anxiety    on meds   Depressed    Dr. Girard meds   Diabetes mellitus without complication (HCC)    on meds   Elevated BP    Elevated glucose    Hand eczema    Heart murmur    Hypertension    on meds   Osteoarthritis    bilateral feet  Other and unspecified hyperlipidemia    on meds   Other, multiple and ill-defined closed fractures of lower limb    Seasonal allergies    Past Surgical History:  Procedure Laterality Date   COLONOSCOPY  2010   HIP SURGERY Right 2012   JOINT REPLACEMENT Right 2011   R 2011   Left leg fracture repair Left 2006   TONSILLECTOMY     Family History  Problem Relation Age of Onset   Rheumatic fever Mother    COPD Mother    Lung cancer Mother    Colon polyps Father 57   Coronary artery disease Father    Other Father        AVR    Colon cancer Father 71   Colon polyps Brother 49   Diabetes Other    Esophageal cancer Neg Hx    Rectal cancer Neg Hx    Stomach cancer Neg Hx    Social History   Socioeconomic History   Marital status: Single    Spouse name: Not on file   Number of children: Not on file   Years of education: Not on file   Highest education level: Bachelor's degree (e.g., BA, AB, BS)  Occupational History   Occupation: RETIRED/Infor/tech Teaching laboratory technician: [employer]  Tobacco Use   Smoking status: Former   Smokeless tobacco: Never  Vaping Use   Vaping status: Never Used  Substance and Sexual Activity   Alcohol use: Yes    Alcohol/week: 4.0 standard drinks of alcohol    Types: 4 Glasses of wine per week   Drug use: No   Sexual activity: Yes  Other Topics Concern   Not on file  Social History Narrative   Regular Exercise- yes   Lives alone/2025   Social Drivers of Health   Financial Resource Strain: Medium Risk (11/14/2023)   Overall Financial Resource Strain (CARDIA)    Difficulty of Paying Living Expenses: Somewhat hard  Food Insecurity: No Food Insecurity (11/01/2023)   Hunger Vital Sign    Worried About Running Out of Food in the Last Year: Never true    Ran Out of Food in the Last Year: Never true  Transportation Needs: No Transportation Needs (11/14/2023)   PRAPARE - Administrator, Civil Service (Medical): No    Lack of Transportation (Non-Medical): No  Physical Activity: Insufficiently Active (11/14/2023)   Exercise Vital Sign    Days of Exercise per Week: 2 days    Minutes of Exercise per Session: 40 min  Stress: No Stress Concern Present (11/14/2023)   [employer] of Occupational Health - Occupational Stress Questionnaire    Feeling of Stress: Only a little  Social Connections: Socially Isolated (11/14/2023)   Social Connection and Isolation Panel    Frequency of Communication with Friends and Family: More than three times a week     Frequency of Social Gatherings with Friends and Family: Twice a week    Attends Religious Services: Never    Database administrator or Organizations: No    Attends Engineer, structural: Not on file    Marital Status: Never married    Tobacco Counseling Counseling given: Not Answered    Clinical Intake:  Pre-visit preparation completed: Yes  Pain : No/denies pain     BMI - recorded: 33.58 Nutritional Status: BMI > 30  Obese Nutritional Risks: None Diabetes: Yes CBG done?: No Did pt. bring in CBG monitor from home?: No  Lab  Results  Component Value Date   HGBA1C 6.4 07/30/2023   HGBA1C 6.9 (H) 04/02/2023   HGBA1C 7.6 (H) 11/29/2022     How often do you need to have someone help you when you read instructions, pamphlets, or other written materials from your doctor or pharmacy?: 1 - Never  Interpreter Needed?: No  Information entered by :: Kyomi Hector, RMA   Activities of Daily Living     11/12/2023    8:40 AM  In your present state of health, do you have any difficulty performing the following activities:  Hearing? 0  Vision? 0  Difficulty concentrating or making decisions? 0  Walking or climbing stairs? 0  Dressing or bathing? 0  Doing errands, shopping? 0  Preparing Food and eating ? N  Using the Toilet? N  In the past six months, have you accidently leaked urine? N  Do you have problems with loss of bowel control? N  Managing your Medications? N  Managing your Finances? N    Patient Care Team: Plotnikov, Karlynn GAILS, MD as PCP - General (Internal Medicine) Tobb, Kardie, DO as PCP - Cardiology (Cardiology) Blinda Ferry, MD (Psychiatry) Josefina Chew, MD (Orthopedic Surgery) Tobb, Kardie, DO as Consulting Physician (Cardiology) [facility]  I have updated your Care Teams any recent Medical Services you may have received from other providers in the past year.     Assessment:   This is a routine wellness examination  for Anzel.  Hearing/Vision screen Hearing Screening - Comments:: Wears hearing aides Vision Screening - Comments:: Wears eyeglasses/ Dr. Octavia   Goals Addressed             This Visit's Progress    Patient Stated   On track    I would like to try and lose some weight.       Depression Screen     11/13/2023    8:21 AM 07/30/2023    1:13 PM 04/02/2023    1:44 PM 11/29/2022    1:31 PM 11/13/2022    9:20 AM 08/21/2022    1:16 PM 10/31/2021    1:27 PM  PHQ 2/9 Scores  PHQ - 2 Score 0 0 0 0 0 0 0  PHQ- 9 Score 0          Fall Risk     11/12/2023    8:40 AM 07/30/2023    1:13 PM 04/02/2023    1:44 PM 11/29/2022    1:31 PM 11/13/2022    9:21 AM  Fall Risk   Falls in the past year?  0 0 0 0  Number falls in past yr: 0 0 0 0 0  Injury with Fall? 0 0 0 0 0  Risk for fall due to :  No Fall Risks No Fall Risks No Fall Risks No Fall Risks  Follow up Falls evaluation completed;Falls prevention discussed Falls evaluation completed Falls evaluation completed Falls evaluation completed Falls evaluation completed    MEDICARE RISK AT HOME:  Medicare Risk at Home Any stairs in or around the home?: (Patient-Rptd) Yes If so, are there any without handrails?: (Patient-Rptd) No Home free of loose throw rugs in walkways, pet beds, electrical cords, etc?: (Patient-Rptd) Yes Adequate lighting in your home to reduce risk of falls?: (Patient-Rptd) Yes Life alert?: (Patient-Rptd) No Use of a cane, walker or w/c?: (Patient-Rptd) No Grab bars in the bathroom?: (Patient-Rptd) No Shower chair or bench in shower?: (Patient-Rptd) No Elevated toilet seat or a handicapped toilet?: (Patient-Rptd) No  TIMED UP AND GO:  Was the test performed?  No  Cognitive Function: Declined/Normal: No cognitive concerns noted by patient or family. Patient alert, oriented, able to answer questions appropriately and recall recent events. No signs of memory loss or confusion.        11/13/2022    9:21 AM  6CIT  Screen  What Year? 0 points  What month? 0 points  What time? 0 points  Count back from 20 0 points  Months in reverse 0 points  Repeat phrase 0 points  Total Score 0 points    Immunizations Immunization History  Administered Date(s) Administered   Influenza Inj Mdck Quad Pf 02/07/2019   Influenza Split 05/03/2011, 03/29/2012   Influenza Whole 03/20/2008, 04/12/2009, 05/18/2010   Influenza, High Dose Seasonal PF 03/11/2020, 01/20/2021, 02/25/2022, 02/04/2023   Influenza,inj,Quad PF,6+ Mos 03/03/2014, 04/09/2015, 03/21/2018   Influenza-Unspecified 02/21/2017   PFIZER(Purple Top)SARS-COV-2 Vaccination 08/01/2019, 08/22/2019, 03/11/2020   PNEUMOCOCCAL CONJUGATE-20 02/04/2023   PPD Test 12/08/2022   Pfizer Covid-19 Vaccine Bivalent Booster 5yrs & up 02/07/2021, 09/27/2021   Pfizer(Comirnaty)Fall Seasonal Vaccine 12 years and older 12/06/2020, 02/04/2023, 08/18/2023   Pneumococcal Polysaccharide-23 10/09/2015   Respiratory Syncytial Virus Vaccine,Recomb Aduvanted(Arexvy) 02/25/2022   Td 07/15/2009   Tdap 03/09/2021   Zoster, Live 10/09/2015    Screening Tests Health Maintenance  Topic Date Due   FOOT EXAM  07/18/2019   Diabetic kidney evaluation - Urine ACR  08/21/2023   Medicare Annual Wellness (AWV)  11/13/2023   INFLUENZA VACCINE  12/21/2023   HEMOGLOBIN A1C  01/30/2024   OPHTHALMOLOGY EXAM  05/21/2024   Diabetic kidney evaluation - eGFR measurement  07/29/2024   Fecal DNA (Cologuard)  08/02/2024   DTaP/Tdap/Td (3 - Td or Tdap) 03/10/2031   Pneumococcal Vaccine: 50+ Years  Completed   COVID-19 Vaccine  Completed   Hepatitis C Screening  Completed   Hepatitis B Vaccines  Aged Out   HPV VACCINES  Aged Out   Meningococcal B Vaccine  Aged Out   Colonoscopy  Discontinued   Zoster Vaccines- Shingrix  Discontinued    Health Maintenance  Health Maintenance Due  Topic Date Due   FOOT EXAM  07/18/2019   Diabetic kidney evaluation - Urine ACR  08/21/2023   Medicare  Annual Wellness (AWV)  11/13/2023   Health Maintenance Items Addressed: Diabetic Foot Exam recommended, See Nurse Notes at the end of this note  Additional Screening:  Vision Screening: Recommended annual ophthalmology exams for early detection of glaucoma and other disorders of the eye. Would you like a referral to an eye doctor? No    Dental Screening: Recommended annual dental exams for proper oral hygiene  Community Resource Referral / Chronic Care Management: CRR required this visit?  No   CCM required this visit?  No   Plan:    I have personally reviewed and noted the following in the patient's chart:   Medical and social history Use of alcohol, tobacco or illicit drugs  Current medications and supplements including opioid prescriptions. Patient is not currently taking opioid prescriptions. Functional ability and status Nutritional status Physical activity Advanced directives List of other physicians Hospitalizations, surgeries, and ER visits in previous 12 months Vitals Screenings to include cognitive, depression, and falls Referrals and appointments  In addition, I have reviewed and discussed with patient certain preventive protocols, quality metrics, and best practice recommendations. A written personalized care plan for preventive services as well as general preventive health recommendations were provided to patient.   Raneisha Bress L Jefferson Fullam,  CMA   11/14/2023   After Visit Summary: (MyChart) Due to this being a telephonic visit, the after visit summary with patients personalized plan was offered to patient via MyChart   Notes: Patient is due for a foot exam and can get that done during his next office visit.  Patient is also due for a UACR, which he will get that done today, as the order has been placed.  Patient is up to date on all other health maintenance with no concerns to address today.  Medical screening examination/treatment/procedure(s) were performed by  non-physician practitioner and as supervising physician I was immediately available for consultation/collaboration.  I agree with above. Karlynn Noel, MD

## 2023-11-15 ENCOUNTER — Ambulatory Visit: Payer: Self-pay | Admitting: Internal Medicine

## 2023-11-25 LAB — GENECONNECT MOLECULAR SCREEN: Genetic Analysis Overall Interpretation: NEGATIVE

## 2024-02-12 ENCOUNTER — Other Ambulatory Visit: Payer: Self-pay | Admitting: Internal Medicine

## 2024-02-12 MED ORDER — ESCITALOPRAM OXALATE 10 MG PO TABS: 10.0000 mg | ORAL_TABLET | Freq: Every day | ORAL | 2 refills | Status: AC

## 2024-02-12 MED ORDER — IRBESARTAN 300 MG PO TABS: 300.0000 mg | ORAL_TABLET | Freq: Every day | ORAL | 2 refills | Status: AC

## 2024-02-12 NOTE — Telephone Encounter (Signed)
 Copied from CRM #[id number]. Topic: Clinical - Medication Refill >> Feb 12, 2024 10:02 AM Burnard DEL wrote: Medication: irbesartan  (AVAPRO ) 300 MG tablet escitalopram  (LEXAPRO ) 10 MG tablet  Has the patient contacted their pharmacy? Yes (Agent: If no, request that the patient contact the pharmacy for the refill. If patient does not wish to contact the pharmacy document the reason why and proceed with request.) (Agent: If yes, when and what did the pharmacy advise?)  This is the patient's preferred pharmacy:    [facility] - [place], [place] - [address] [place] [address] Phone: [phone] Fax: [phone]  Is this the correct pharmacy for this prescription? Yes If no, delete pharmacy and type the correct one.   Has the prescription been filled recently? Yes  Is the patient out of the medication? Yes  Has the patient been seen for an appointment in the last year OR does the patient have an upcoming appointment? Yes  Can we respond through MyChart? Yes  Agent: Please be advised that Rx refills may take up to 3 business days. We ask that you follow-up with your pharmacy.

## 2024-05-11 ENCOUNTER — Encounter: Payer: Self-pay | Admitting: Internal Medicine

## 2024-06-25 ENCOUNTER — Ambulatory Visit: Admitting: Internal Medicine

## 2024-06-25 ENCOUNTER — Encounter: Payer: Self-pay | Admitting: Internal Medicine

## 2024-06-25 VITALS — BP 144/86 | HR 70 | Ht 70.0 in | Wt 235.0 lb

## 2024-06-25 DIAGNOSIS — F32A Depression, unspecified: Secondary | ICD-10-CM

## 2024-06-25 DIAGNOSIS — E785 Hyperlipidemia, unspecified: Secondary | ICD-10-CM

## 2024-06-25 DIAGNOSIS — Z0000 Encounter for general adult medical examination without abnormal findings: Secondary | ICD-10-CM

## 2024-06-25 DIAGNOSIS — I10 Essential (primary) hypertension: Secondary | ICD-10-CM

## 2024-06-25 DIAGNOSIS — E119 Type 2 diabetes mellitus without complications: Secondary | ICD-10-CM

## 2024-06-25 DIAGNOSIS — I2583 Coronary atherosclerosis due to lipid rich plaque: Secondary | ICD-10-CM

## 2024-06-25 DIAGNOSIS — N320 Bladder-neck obstruction: Secondary | ICD-10-CM

## 2024-06-25 NOTE — Assessment & Plan Note (Signed)
BP is better On Irbesartan - well controlled

## 2024-06-25 NOTE — Assessment & Plan Note (Addendum)
 Cont on Crestor. Koren cooks, eats at home most of the time.

## 2024-06-25 NOTE — Assessment & Plan Note (Signed)
-  On Atorvastatin

## 2024-06-25 NOTE — Progress Notes (Signed)
 "  Subjective:  Patient ID: Roberto Swanson, male    DOB: 1955/12/06  Age: 69 y.o. MRN: [id number]  CC: Medical Management of Chronic Issues (Discuss current prescriptions and any recommendations )   HPI Roberto Swanson presents for HTN, DM, OA BP ok at home  Outpatient Medications Prior to Visit  Medication Sig Dispense Refill   Ascorbic Acid (VITAMIN C) 1000 MG tablet Take 1,000 mg by mouth daily.     aspirin EC 81 MG tablet Take 81 mg by mouth daily.     atorvastatin  (LIPITOR) 40 MG tablet TAKE 1 TABLET(40 MG) BY MOUTH DAILY 90 tablet 3   b complex vitamins tablet Take 1 tablet by mouth daily.     cholecalciferol (VITAMIN D) 1000 UNITS tablet Take 1,000 Units by mouth daily.     escitalopram  (LEXAPRO ) 10 MG tablet Take 1 tablet (10 mg total) by mouth daily. 90 tablet 2   irbesartan  (AVAPRO ) 300 MG tablet Take 1 tablet (300 mg total) by mouth daily. 90 tablet 2   metFORMIN  (GLUCOPHAGE ) 1000 MG tablet TAKE 1 TABLET TWICE DAILY 180 tablet 3   Multiple Vitamin (MULTIVITAMIN) capsule Take 1 capsule by mouth daily.     NAPROXEN PO Take 1 tablet by mouth every 6 (six) hours as needed (arthritis).     tirzepatide  (MOUNJARO ) 15 MG/0.5ML Pen Inject 15 mg into the skin once a week. 6 mL 5   No facility-administered medications prior to visit.    ROS: Review of Systems  Constitutional:  Negative for appetite change, fatigue and unexpected weight change.  HENT:  Negative for congestion, nosebleeds, sneezing, sore throat and trouble swallowing.   Eyes:  Negative for itching and visual disturbance.  Respiratory:  Negative for cough.   Cardiovascular:  Negative for chest pain, palpitations and leg swelling.  Gastrointestinal:  Negative for abdominal distention, blood in stool, diarrhea and nausea.  Genitourinary:  Negative for frequency and hematuria.  Musculoskeletal:  Negative for back pain, gait problem, joint swelling and neck pain.  Skin:  Negative for rash.  Neurological:  Negative  for dizziness, tremors, speech difficulty and weakness.  Psychiatric/Behavioral:  Negative for agitation, dysphoric mood, sleep disturbance and suicidal ideas. The patient is not nervous/anxious.     Objective:  BP (!) 144/86   Pulse 70   Ht 5' 10 (1.778 m)   Wt 235 lb (106.6 kg)   SpO2 99%   BMI 33.72 kg/m   BP Readings from Last 3 Encounters:  06/25/24 (!) 144/86  11/13/23 135/80  07/30/23 130/80    Wt Readings from Last 3 Encounters:  06/25/24 235 lb (106.6 kg)  11/14/23 234 lb (106.1 kg)  11/13/23 234 lb (106.1 kg)    Physical Exam Constitutional:      General: He is not in acute distress.    Appearance: He is well-developed. He is obese.     Comments: NAD  Eyes:     Conjunctiva/sclera: Conjunctivae normal.     Pupils: Pupils are equal, round, and reactive to light.  Neck:     Thyroid : No thyromegaly.     Vascular: No JVD.  Cardiovascular:     Rate and Rhythm: Normal rate and regular rhythm.     Heart sounds: Normal heart sounds. No murmur heard.    No friction rub. No gallop.  Pulmonary:     Effort: Pulmonary effort is normal. No respiratory distress.     Breath sounds: Normal breath sounds. No wheezing or rales.  Chest:  Chest wall: No tenderness.  Abdominal:     General: Bowel sounds are normal. There is no distension.     Palpations: Abdomen is soft. There is no mass.     Tenderness: There is no abdominal tenderness. There is no guarding or rebound.  Musculoskeletal:        General: No tenderness. Normal range of motion.     Cervical back: Normal range of motion.     Right lower leg: No edema.     Left lower leg: No edema.  Lymphadenopathy:     Cervical: No cervical adenopathy.  Skin:    General: Skin is warm and dry.     Findings: No rash.  Neurological:     Mental Status: He is alert and oriented to person, place, and time.     Cranial Nerves: No cranial nerve deficit.     Motor: No abnormal muscle tone.     Coordination: Coordination  normal.     Gait: Gait normal.     Deep Tendon Reflexes: Reflexes are normal and symmetric.  Psychiatric:        Behavior: Behavior normal.        Thought Content: Thought content normal.        Judgment: Judgment normal.     Lab Results  Component Value Date   WBC 7.5 11/29/2022   HGB 14.4 11/29/2022   HCT 43.8 11/29/2022   PLT 262.0 11/29/2022   GLUCOSE 123 (H) 07/30/2023   CHOL 135 11/29/2022   TRIG 349.0 (H) 11/29/2022   HDL 37.80 (L) 11/29/2022   LDLDIRECT 65.0 11/29/2022   LDLCALC 58 12/09/2013   ALT 27 07/30/2023   AST 16 07/30/2023   NA 139 07/30/2023   K 3.6 07/30/2023   CL 103 07/30/2023   CREATININE 1.27 07/30/2023   BUN 18 07/30/2023   CO2 25 07/30/2023   TSH 1.21 11/29/2022   PSA 1.50 11/29/2022   INR 1.09 06/16/2010   HGBA1C 6.4 07/30/2023   MICROALBUR 4.4 (H) 11/14/2023    No results found.  Assessment & Plan:   Problem List Items Addressed This Visit     Dyslipidemia   Cont on Crestor. Koren cooks, eats at home most of the time.      Chronic depression   Chronic  On Wellbutrin , Lexapro       HYPERTENSION   BP is better On Irbesartan  - well controlled      Well adult exam   Relevant Orders   TSH   Urinalysis   CBC with Differential/Platelet   Lipid panel   PSA   Comprehensive metabolic panel with GFR   Hemoglobin A1c   Diabetes mellitus, type 2 (HCC) - Primary   On Mounjaro  15 mg/wk - doing well Cont on Crestor. Koren cooks, eats at home most of the time      Relevant Orders   TSH   Urinalysis   CBC with Differential/Platelet   Lipid panel   PSA   Comprehensive metabolic panel with GFR   Hemoglobin A1c   Coronary atherosclerosis   On Atorvastatin       Relevant Orders   TSH   Urinalysis   CBC with Differential/Platelet   Lipid panel   PSA   Comprehensive metabolic panel with GFR   Hemoglobin A1c   Other Visit Diagnoses       Bladder neck obstruction       Relevant Orders   PSA         No orders of  the  defined types were placed in this encounter.     Follow-up: Return in about 3 months (around 09/22/2024) for a follow-up visit.  Marolyn Noel, MD "

## 2024-06-25 NOTE — Assessment & Plan Note (Signed)
Chronic  On Wellbutrin, Lexapro

## 2024-06-25 NOTE — Assessment & Plan Note (Addendum)
 On Mounjaro  15 mg/wk - doing well Cont on Crestor. Koren cooks, eats at home most of the time

## 2024-11-14 ENCOUNTER — Ambulatory Visit

## 2024-12-17 ENCOUNTER — Ambulatory Visit: Admitting: Internal Medicine
